# Patient Record
Sex: Female | Born: 1980
Health system: Southern US, Community
[De-identification: ages and names within clinical notes are randomized; demographics above are authoritative.]

## PROBLEM LIST (undated history)

## (undated) DIAGNOSIS — A630 Anogenital (venereal) warts: Secondary | ICD-10-CM

## (undated) DIAGNOSIS — N83209 Unspecified ovarian cyst, unspecified side: Secondary | ICD-10-CM

## (undated) DIAGNOSIS — Z9089 Acquired absence of other organs: Secondary | ICD-10-CM

## (undated) DIAGNOSIS — K449 Diaphragmatic hernia without obstruction or gangrene: Secondary | ICD-10-CM

## (undated) DIAGNOSIS — K5720 Diverticulitis of large intestine with perforation and abscess without bleeding: Secondary | ICD-10-CM

## (undated) DIAGNOSIS — F1291 Cannabis use, unspecified, in remission: Secondary | ICD-10-CM

## (undated) DIAGNOSIS — K5792 Diverticulitis of intestine, part unspecified, without perforation or abscess without bleeding: Secondary | ICD-10-CM

## (undated) DIAGNOSIS — E538 Deficiency of other specified B group vitamins: Secondary | ICD-10-CM

## (undated) DIAGNOSIS — B977 Papillomavirus as the cause of diseases classified elsewhere: Secondary | ICD-10-CM

## (undated) DIAGNOSIS — Z8742 Personal history of other diseases of the female genital tract: Secondary | ICD-10-CM

## (undated) DIAGNOSIS — Z87898 Personal history of other specified conditions: Secondary | ICD-10-CM

## (undated) DIAGNOSIS — Z8619 Personal history of other infectious and parasitic diseases: Secondary | ICD-10-CM

## (undated) DIAGNOSIS — R911 Solitary pulmonary nodule: Secondary | ICD-10-CM

## (undated) HISTORY — PX: TONSILLECTOMY: SUR1361

## (undated) HISTORY — PX: POLYPECTOMY: SHX149

## (undated) HISTORY — DX: Diaphragmatic hernia without obstruction or gangrene: K44.9

## (undated) HISTORY — PX: COLOSTOMY: SHX63

## (undated) HISTORY — DX: Solitary pulmonary nodule: R91.1

## (undated) HISTORY — DX: Diverticulitis of large intestine with perforation and abscess without bleeding: K57.20

---

## 2013-03-15 DIAGNOSIS — Z8619 Personal history of other infectious and parasitic diseases: Secondary | ICD-10-CM

## 2013-03-15 HISTORY — DX: Personal history of other infectious and parasitic diseases: Z86.19

## 2013-11-29 ENCOUNTER — Encounter (HOSPITAL_COMMUNITY): Payer: Self-pay | Admitting: *Deleted

## 2013-11-29 ENCOUNTER — Inpatient Hospital Stay (HOSPITAL_COMMUNITY): Payer: Medicaid Other

## 2013-11-29 ENCOUNTER — Inpatient Hospital Stay (HOSPITAL_COMMUNITY)
Admission: AD | Admit: 2013-11-29 | Discharge: 2013-11-29 | Disposition: A | Discharge status: Home / Self Care | Payer: Medicaid Other | Source: Ambulatory Visit | Attending: Obstetrics & Gynecology | Admitting: Obstetrics & Gynecology

## 2013-11-29 DIAGNOSIS — O99330 Smoking (tobacco) complicating pregnancy, unspecified trimester: Secondary | ICD-10-CM | POA: Diagnosis not present

## 2013-11-29 DIAGNOSIS — O98819 Other maternal infectious and parasitic diseases complicating pregnancy, unspecified trimester: Secondary | ICD-10-CM | POA: Diagnosis not present

## 2013-11-29 DIAGNOSIS — O208 Other hemorrhage in early pregnancy: Secondary | ICD-10-CM | POA: Insufficient documentation

## 2013-11-29 DIAGNOSIS — Z87891 Personal history of nicotine dependence: Secondary | ICD-10-CM | POA: Diagnosis not present

## 2013-11-29 DIAGNOSIS — O43899 Other placental disorders, unspecified trimester: Secondary | ICD-10-CM

## 2013-11-29 DIAGNOSIS — O418X10 Other specified disorders of amniotic fluid and membranes, first trimester, not applicable or unspecified: Secondary | ICD-10-CM

## 2013-11-29 DIAGNOSIS — A599 Trichomoniasis, unspecified: Secondary | ICD-10-CM

## 2013-11-29 DIAGNOSIS — O468X1 Other antepartum hemorrhage, first trimester: Secondary | ICD-10-CM

## 2013-11-29 DIAGNOSIS — A5901 Trichomonal vulvovaginitis: Secondary | ICD-10-CM | POA: Diagnosis not present

## 2013-11-29 DIAGNOSIS — O209 Hemorrhage in early pregnancy, unspecified: Secondary | ICD-10-CM | POA: Diagnosis present

## 2013-11-29 HISTORY — DX: Deficiency of other specified B group vitamins: E53.8

## 2013-11-29 HISTORY — DX: Papillomavirus as the cause of diseases classified elsewhere: B97.7

## 2013-11-29 HISTORY — DX: Diverticulitis of intestine, part unspecified, without perforation or abscess without bleeding: K57.92

## 2013-11-29 HISTORY — DX: Unspecified ovarian cyst, unspecified side: N83.209

## 2013-11-29 LAB — CBC WITH DIFFERENTIAL/PLATELET
BASOS PCT: 0 % (ref 0–1)
Basophils Absolute: 0.1 10*3/uL (ref 0.0–0.1)
Eosinophils Absolute: 0.2 10*3/uL (ref 0.0–0.7)
Eosinophils Relative: 2 % (ref 0–5)
HEMATOCRIT: 37.3 % (ref 36.0–46.0)
HEMOGLOBIN: 13.1 g/dL (ref 12.0–15.0)
Lymphocytes Relative: 18 % (ref 12–46)
Lymphs Abs: 2.4 10*3/uL (ref 0.7–4.0)
MCH: 31.6 pg (ref 26.0–34.0)
MCHC: 35.1 g/dL (ref 30.0–36.0)
MCV: 89.9 fL (ref 78.0–100.0)
MONO ABS: 0.8 10*3/uL (ref 0.1–1.0)
MONOS PCT: 6 % (ref 3–12)
Neutro Abs: 9.6 10*3/uL — ABNORMAL HIGH (ref 1.7–7.7)
Neutrophils Relative %: 74 % (ref 43–77)
Platelets: 287 10*3/uL (ref 150–400)
RBC: 4.15 MIL/uL (ref 3.87–5.11)
RDW: 13.2 % (ref 11.5–15.5)
WBC: 13.1 10*3/uL — ABNORMAL HIGH (ref 4.0–10.5)

## 2013-11-29 LAB — WET PREP, GENITAL
Clue Cells Wet Prep HPF POC: NONE SEEN
Yeast Wet Prep HPF POC: NONE SEEN

## 2013-11-29 LAB — HIV ANTIBODY (ROUTINE TESTING W REFLEX): HIV: NONREACTIVE

## 2013-11-29 LAB — ABO/RH: ABO/RH(D): A POS

## 2013-11-29 LAB — HCG, QUANTITATIVE, PREGNANCY: hCG, Beta Chain, Quant, S: 37647 m[IU]/mL — ABNORMAL HIGH (ref <5)

## 2013-11-29 LAB — POCT PREGNANCY, URINE: Preg Test, Ur: POSITIVE — AB

## 2013-11-29 MED ORDER — METRONIDAZOLE 500 MG PO TABS: 2000.0000 mg | ORAL_TABLET | Freq: Once | ORAL | Status: DC

## 2013-11-29 NOTE — MAU Provider Note (Signed)
History     CSN: [id number]  Arrival date and time: 11/29/13 1011   First Provider Initiated Contact with Patient 11/29/13 1102      Chief Complaint  Patient presents with  . Vaginal Bleeding   Vaginal Bleeding Associated symptoms include constipation and nausea. Pertinent negatives include no chills, diarrhea, dysuria, fever, headaches, urgency or vomiting.   Pt, Kristin Sawyer [redacted]w[redacted]d, is a 33 y.o. G4P0 Caucasian female with a 1 day history of vaginal bleeding and a positive home pregnancy test last month. Pt states that this morning after voiding she noticed streaks of blood on the toilet paper after wiping. Pt states that bleeding was light and without clots. Denies pelvic and abdominal pain. No bleeding since isolated episode this morning. Pt has never experienced these symptoms before as this is her first pregnancy.  Pt is sexually active. Last time having intercourse was last week. History of HPV. Pt does not have concerns about new STDs.   OB History   Grav Para Term Preterm Abortions TAB SAB Ect Mult Living   1               Past Medical History  Diagnosis Date  . B12 nutritional deficiency   . HPV in female   . Diverticulitis   . Ovarian cyst     Past Surgical History  Procedure Laterality Date  . Tonsillectomy      Family History  Problem Relation Age of Onset  . Diabetes Mother   . Diabetes Father   . Heart disease Father   . Diabetes Maternal Grandmother     History  Substance Use Topics  . Smoking status: Current Every Day Smoker -- 1.00 packs/day  . Smokeless tobacco: Not on file  . Alcohol Use: No    Allergies: Allergies not on file  No prescriptions prior to admission    Review of Systems  Constitutional: Negative for fever, chills, malaise/fatigue and diaphoresis.  Eyes: Negative for blurred vision and double vision.  Respiratory: Negative for cough and shortness of breath.   Cardiovascular: Negative for chest pain and palpitations.   Gastrointestinal: Positive for nausea and constipation. Negative for vomiting and diarrhea.  Genitourinary: Positive for vaginal bleeding. Negative for dysuria and urgency.  Neurological: Negative for dizziness, weakness and headaches.   Physical Exam   Blood pressure 120/56, pulse 82, temperature 98.6 F (37 C), temperature source Oral, resp. rate 18, height 5\' 3"  (1.6 m), weight 86.909 kg (191 lb 9.6 oz), last menstrual period 10/01/2013.  Physical Exam  Constitutional: She is oriented to person, place, and time. She appears well-developed and well-nourished.  HENT:  Head: Normocephalic and atraumatic.  Cardiovascular: Normal rate, regular rhythm, normal heart sounds and intact distal pulses.   No murmur heard. Respiratory: Effort normal and breath sounds normal. No respiratory distress.  GI: Soft. Bowel sounds are normal. She exhibits no distension. There is no tenderness. There is no guarding.  Genitourinary: There is no rash, lesion or injury on the right labia. There is no rash, lesion or injury on the left labia. Cervix exhibits no motion tenderness.  Speculum exam: Vagina - Small amount of creamy discharge, no blood, no odor Cervix - No contact bleeding Bimanual exam: Cervix closed Uterus non tender, normal size Adnexa non tender, no masses bilaterally GC/Chlam, wet prep done Chaperone present for exam.    Neurological: She is alert and oriented to person, place, and time.  Skin: Skin is warm.  Psychiatric: She has a normal mood  and affect. Her behavior is normal.   Results for orders placed during the hospital encounter of 11/29/13 (from the past 24 hour(s))  POCT PREGNANCY, URINE     Status: Abnormal   Collection Time    11/29/13 10:49 AM      Result Value Ref Range   Preg Test, Ur POSITIVE (*) NEGATIVE  CBC WITH DIFFERENTIAL     Status: Abnormal   Collection Time    11/29/13 11:06 AM      Result Value Ref Range   WBC 13.1 (*) 4.0 - 10.5 K/uL   RBC 4.15  3.87 -  5.11 MIL/uL   Hemoglobin 13.1  12.0 - 15.0 g/dL   HCT 37.3  36.0 - 46.0 %   MCV 89.9  78.0 - 100.0 fL   MCH 31.6  26.0 - 34.0 pg   MCHC 35.1  30.0 - 36.0 g/dL   RDW 13.2  11.5 - 15.5 %   Platelets 287  150 - 400 K/uL   Neutrophils Relative % 74  43 - 77 %   Neutro Abs 9.6 (*) 1.7 - 7.7 K/uL   Lymphocytes Relative 18  12 - 46 %   Lymphs Abs 2.4  0.7 - 4.0 K/uL   Monocytes Relative 6  3 - 12 %   Monocytes Absolute 0.8  0.1 - 1.0 K/uL   Eosinophils Relative 2  0 - 5 %   Eosinophils Absolute 0.2  0.0 - 0.7 K/uL   Basophils Relative 0  0 - 1 %   Basophils Absolute 0.1  0.0 - 0.1 K/uL  ABO/RH     Status: None   Collection Time    11/29/13 11:06 AM      Result Value Ref Range   ABO/RH(D) A POS    HCG, QUANTITATIVE, PREGNANCY     Status: Abnormal   Collection Time    11/29/13 11:06 AM      Result Value Ref Range   hCG, Beta Chain, Quant, [place] [address] (*) <5 mIU/mL  WET PREP, GENITAL     Status: Abnormal   Collection Time    11/29/13 11:47 AM      Result Value Ref Range   Yeast Wet Prep HPF POC NONE SEEN  NONE SEEN   Trich, Wet Prep FEW (*) NONE SEEN   Clue Cells Wet Prep HPF POC NONE SEEN  NONE SEEN   WBC, Wet Prep HPF POC MODERATE (*) NONE SEEN   US Ob Comp Less 14 Wks  11/29/2013   CLINICAL DATA:  Vaginal bleeding  EXAM: OBSTETRIC <14 WK [place] AND TRANSVAGINAL OB US  TECHNIQUE: Both transabdominal and transvaginal ultrasound examinations were performed for complete evaluation of the gestation as well as the maternal uterus, adnexal regions, and pelvic cul-de-sac. Transvaginal technique was performed to assess early pregnancy.  COMPARISON:  None.  FINDINGS: Intrauterine gestational sac: Visualized/normal in shape.  Yolk sac:  Visualized  Embryo:  Visualized  Cardiac Activity: Visualized  Heart Rate:  165 bpm  CRL:   26  mm   9 w 3 d                  [place] EDC: July 01, 2014  Maternal uterus/adnexae: There is a subchorionic hemorrhage inferior to the gestational sac measuring 1.3 x 0.7 x 0.7 cm.  Cervical os is closed. Maternal uterus otherwise appears normal. There are no maternal extrauterine pelvic or adnexal masses. No maternal free pelvic fluid.  IMPRESSION: Single live intrauterine gestation with estimated  gestational age of 38+ weeks. Small subchorionic hemorrhage.   Electronically Signed   By: Lowella Grip M.D.   On: 11/29/2013 12:49   US Ob Transvaginal  11/29/2013   CLINICAL DATA:  Vaginal bleeding  EXAM: OBSTETRIC <14 WK [place] AND TRANSVAGINAL OB US  TECHNIQUE: Both transabdominal and transvaginal ultrasound examinations were performed for complete evaluation of the gestation as well as the maternal uterus, adnexal regions, and pelvic cul-de-sac. Transvaginal technique was performed to assess early pregnancy.  COMPARISON:  None.  FINDINGS: Intrauterine gestational sac: Visualized/normal in shape.  Yolk sac:  Visualized  Embryo:  Visualized  Cardiac Activity: Visualized  Heart Rate:  165 bpm  CRL:   26  mm   9 w 3 d                  [place] EDC: July 01, 2014  Maternal uterus/adnexae: There is a subchorionic hemorrhage inferior to the gestational sac measuring 1.3 x 0.7 x 0.7 cm. Cervical os is closed. Maternal uterus otherwise appears normal. There are no maternal extrauterine pelvic or adnexal masses. No maternal free pelvic fluid.  IMPRESSION: Single live intrauterine gestation with estimated gestational age of 28+ weeks. Small subchorionic hemorrhage.   Electronically Signed   By: Lowella Grip M.D.   On: 11/29/2013 12:49    MAU Course  Procedures None  MDM +UPT UA, wet prep, GC/chlamydia, CBC, ABO/Rh, quant hCG, HIV and [place] today  Gabriel Cirri, PA-S2 11/29/2013, 11:05 AM  Assessment and Plan  A: [facility]UP at [redacted]w[redacted]d Small subchorionic hemorrhage Trichomonas  P: Discharge home Rx for Flagyl given to patient Partner treatment advised Bleeding precautions discussed Patient given list of area OB providers and advised to start prenatal care ASAP Medicaid is pending Patient  may return to MAU as needed or if her condition were to change or worsen  I have seen and evaluated the patient with the NP/PA/Med student. I agree with the assessment and plan as written above.   Luvenia Redden, PA-C  11/29/2013 12:58 PM

## 2013-11-29 NOTE — MAU Note (Signed)
Had one ep[isode of bleeding this AM but is not bleeding now; denies cramping;

## 2013-11-29 NOTE — MAU Note (Signed)
Pt reports some vaginal bleedng this morning when she wiped. Denies pain or cramping. Had +HPT last month. Has not started prenatal care yet.

## 2013-11-29 NOTE — Discharge Instructions (Signed)
Subchorionic Hematoma A subchorionic hematoma is a gathering of blood between the outer wall of the placenta and the inner wall of the womb (uterus). The placenta is the organ that connects the fetus to the wall of the uterus. The placenta performs the feeding, breathing (oxygen to the fetus), and waste removal (excretory work) of the fetus.  Subchorionic hematoma is the most common abnormality found on a result from ultrasonography done during the first trimester or early second trimester of pregnancy. If there has been little or no vaginal bleeding, early small hematomas usually shrink on their own and do not affect your baby or pregnancy. The blood is gradually absorbed over 1-2 weeks. When bleeding starts later in pregnancy or the hematoma is larger or occurs in an older pregnant woman, the outcome may not be as good. Larger hematomas may get bigger, which increases the chances for miscarriage. Subchorionic hematoma also increases the risk of premature detachment of the placenta from the uterus, preterm (premature) labor, and stillbirth. HOME CARE INSTRUCTIONS  Stay on bed rest if your health care provider recommends this. Although bed rest will not prevent more bleeding or prevent a miscarriage, your health care provider may recommend bed rest until you are advised otherwise.  Avoid heavy lifting (more than 10 lb [4.5 kg]), exercise, sexual intercourse, or douching as directed by your health care provider.  Keep track of the number of pads you use each day and how soaked (saturated) they are. Write down this information.  Do not use tampons.  Keep all follow-up appointments as directed by your health care provider. Your health care provider may ask you to have follow-up blood tests or ultrasound tests or both. SEEK IMMEDIATE MEDICAL CARE IF:  You have severe cramps in your stomach, back, abdomen, or pelvis.  You have a fever.  You pass large clots or tissue. Save any tissue for your health  care provider to look at.  Your bleeding increases or you become lightheaded, feel weak, or have fainting episodes. Document Released: 06/16/2006 Document Revised: 07/16/2013 Document Reviewed: 09/28/2012 [facility] Patient Information 2015 [place], [place]. This information is not intended to replace advice given to you by your health care provider. Make sure you discuss any questions you have with your health care provider. Trichomoniasis Trichomoniasis is an infection caused by an organism called Trichomonas. The infection can affect both women and men. In women, the outer female genitalia and the vagina are affected. In men, the penis is mainly affected, but the prostate and other reproductive organs can also be involved. Trichomoniasis is a sexually transmitted infection (STI) and is most often passed to another person through sexual contact.  RISK FACTORS  Having unprotected sexual intercourse.  Having sexual intercourse with an infected partner. SIGNS AND SYMPTOMS  Symptoms of trichomoniasis in women include:  Abnormal gray-green frothy vaginal discharge.  Itching and irritation of the vagina.  Itching and irritation of the area outside the vagina. Symptoms of trichomoniasis in men include:   Penile discharge with or without pain.  Pain during urination. This results from inflammation of the urethra. DIAGNOSIS  Trichomoniasis may be found during a Pap test or physical exam. Your health care provider may use one of the following methods to help diagnose this infection:  Examining vaginal discharge under a microscope. For men, urethral discharge would be examined.  Testing the pH of the vagina with a test tape.  Using a vaginal swab test that checks for the Trichomonas organism. A test is available that  provides results within a few minutes.  Doing a culture test for the organism. This is not usually needed. TREATMENT   You may be given medicine to fight the infection. Women  should inform their health care provider if they could be or are pregnant. Some medicines used to treat the infection should not be taken during pregnancy.  Your health care provider may recommend over-the-counter medicines or creams to decrease itching or irritation.  Your sexual partner will need to be treated if infected. HOME CARE INSTRUCTIONS   Take medicines only as directed by your health care provider.  Take over-the-counter medicine for itching or irritation as directed by your health care provider.  Do not have sexual intercourse while you have the infection.  Women should not douche or wear tampons while they have the infection.  Discuss your infection with your partner. Your partner may have gotten the infection from you, or you may have gotten it from your partner.  Have your sex partner get examined and treated if necessary.  Practice safe, informed, and protected sex.  See your health care provider for other STI testing. SEEK MEDICAL CARE IF:   You still have symptoms after you finish your medicine.  You develop abdominal pain.  You have pain when you urinate.  You have bleeding after sexual intercourse.  You develop a rash.  Your medicine makes you sick or makes you throw up (vomit). MAKE SURE YOU:  Understand these instructions.  Will watch your condition.  Will get help right away if you are not doing well or get worse. Document Released: 08/25/2000 Document Revised: 07/16/2013 Document Reviewed: 12/11/2012 [facility] Patient Information 2015 [place], [place]. This information is not intended to replace advice given to you by your health care provider. Make sure you discuss any questions you have with your health care provider.

## 2013-11-29 NOTE — MAU Provider Note (Signed)
Attestation of Attending Supervision of Advanced Practitioner (CNM/NP): Evaluation and management procedures were performed by the Advanced Practitioner under my supervision and collaboration.  I have reviewed the Advanced Practitioner's note and chart, and I agree with the management and plan.  HARRAWAY-SMITH, Loye Vento 5:10 PM

## 2013-11-30 LAB — GC/CHLAMYDIA PROBE AMP
CT PROBE, AMP APTIMA: NEGATIVE
GC Probe RNA: NEGATIVE

## 2013-12-25 ENCOUNTER — Other Ambulatory Visit: Payer: Self-pay | Admitting: Obstetrics & Gynecology

## 2013-12-25 LAB — OB RESULTS CONSOLE GC/CHLAMYDIA
CHLAMYDIA, DNA PROBE: NEGATIVE
GC PROBE AMP, GENITAL: NEGATIVE

## 2013-12-25 LAB — OB RESULTS CONSOLE ANTIBODY SCREEN: Antibody Screen: NEGATIVE

## 2013-12-25 LAB — OB RESULTS CONSOLE RUBELLA ANTIBODY, IGM: Rubella: IMMUNE

## 2013-12-25 LAB — OB RESULTS CONSOLE ABO/RH: RH Type: POSITIVE

## 2013-12-25 LAB — OB RESULTS CONSOLE HEPATITIS B SURFACE ANTIGEN: Hepatitis B Surface Ag: NEGATIVE

## 2013-12-25 LAB — OB RESULTS CONSOLE HIV ANTIBODY (ROUTINE TESTING): HIV: NONREACTIVE

## 2013-12-25 LAB — OB RESULTS CONSOLE RPR: RPR: NONREACTIVE

## 2013-12-26 LAB — CYTOLOGY - PAP

## 2014-01-15 ENCOUNTER — Encounter (HOSPITAL_COMMUNITY): Payer: Self-pay | Admitting: *Deleted

## 2014-01-17 ENCOUNTER — Other Ambulatory Visit: Payer: Self-pay | Admitting: Obstetrics & Gynecology

## 2014-01-17 DIAGNOSIS — N63 Unspecified lump in unspecified breast: Secondary | ICD-10-CM

## 2014-01-18 ENCOUNTER — Encounter (HOSPITAL_COMMUNITY): Payer: Self-pay

## 2014-01-22 ENCOUNTER — Ambulatory Visit
Admission: RE | Admit: 2014-01-22 | Discharge: 2014-01-22 | Disposition: A | Discharge status: Home / Self Care | Payer: Medicaid Other | Source: Ambulatory Visit | Attending: Obstetrics & Gynecology | Admitting: Obstetrics & Gynecology

## 2014-01-22 DIAGNOSIS — N63 Unspecified lump in unspecified breast: Secondary | ICD-10-CM

## 2014-03-15 NOTE — L&D Delivery Note (Signed)
Delivery Note At 1:21 AM a viable and healthy female was delivered via Vaginal, Spontaneous Delivery (Presentation: Left Occiput Anterior).  APGAR: 8, 9; weight  pending.   Placenta status: Intact, Spontaneous.  Cord: 3 vessels with a loose nuchal cord x 1  Pt presented to MAU in advanced labor. Upon arrival she was 6/c/0. She quickly progressed to complete. Amniotomy was performed for meconium stained fluid and the patient quickly delivered thereafter with 2 pushes. Placenta was delivered spontaneously, intact. No lacerations required repair. EBL was minimal. Uterus was firm after delivery. Estimated birth weight 5-5.5 lbs  Anesthesia: None  Episiotomy: None Lacerations: None Suture Repair: N/A Est. Blood Loss (mL): 100  Mom to postpartum.  Baby to Couplet care / Skin to Skin.  Akili Corsetti H. 06/29/2014, 1:35 AM

## 2014-05-28 LAB — OB RESULTS CONSOLE GBS: GBS: NEGATIVE

## 2014-06-24 ENCOUNTER — Ambulatory Visit (INDEPENDENT_AMBULATORY_CARE_PROVIDER_SITE_OTHER): Payer: Self-pay | Admitting: Pediatrics

## 2014-06-24 DIAGNOSIS — Z7681 Expectant parent(s) prebirth pediatrician visit: Secondary | ICD-10-CM

## 2014-06-24 DIAGNOSIS — Z3490 Encounter for supervision of normal pregnancy, unspecified, unspecified trimester: Secondary | ICD-10-CM

## 2014-06-25 NOTE — Progress Notes (Signed)
Prenatal counseling for impending newborn done-- Z76.81  

## 2014-06-28 ENCOUNTER — Inpatient Hospital Stay (HOSPITAL_COMMUNITY)
Admission: AD | Admit: 2014-06-28 | Discharge: 2014-07-02 | DRG: 775 | Disposition: A | Discharge status: Home / Self Care | Payer: Medicaid Other | Source: Ambulatory Visit | Attending: Obstetrics and Gynecology | Admitting: Obstetrics and Gynecology

## 2014-06-28 ENCOUNTER — Encounter (HOSPITAL_COMMUNITY): Payer: Self-pay | Admitting: *Deleted

## 2014-06-28 DIAGNOSIS — O09523 Supervision of elderly multigravida, third trimester: Secondary | ICD-10-CM

## 2014-06-28 DIAGNOSIS — Z3A38 38 weeks gestation of pregnancy: Secondary | ICD-10-CM | POA: Diagnosis present

## 2014-06-28 DIAGNOSIS — IMO0001 Reserved for inherently not codable concepts without codable children: Secondary | ICD-10-CM

## 2014-06-28 DIAGNOSIS — O99334 Smoking (tobacco) complicating childbirth: Secondary | ICD-10-CM | POA: Diagnosis present

## 2014-06-28 DIAGNOSIS — F17210 Nicotine dependence, cigarettes, uncomplicated: Secondary | ICD-10-CM | POA: Diagnosis present

## 2014-06-28 HISTORY — DX: Anogenital (venereal) warts: A63.0

## 2014-06-28 HISTORY — DX: Personal history of other specified conditions: Z87.898

## 2014-06-28 HISTORY — DX: Personal history of other infectious and parasitic diseases: Z86.19

## 2014-06-28 HISTORY — DX: Personal history of other diseases of the female genital tract: Z87.42

## 2014-06-28 HISTORY — DX: Acquired absence of other organs: Z90.89

## 2014-06-28 HISTORY — DX: Cannabis use, unspecified, in remission: F12.91

## 2014-06-28 NOTE — MAU Note (Signed)
Contractions since 1500. Ctxs closer and stronger. Denies LOF or bleeding

## 2014-06-29 ENCOUNTER — Encounter (HOSPITAL_COMMUNITY): Payer: Self-pay | Admitting: *Deleted

## 2014-06-29 DIAGNOSIS — IMO0001 Reserved for inherently not codable concepts without codable children: Secondary | ICD-10-CM

## 2014-06-29 DIAGNOSIS — F17210 Nicotine dependence, cigarettes, uncomplicated: Secondary | ICD-10-CM | POA: Diagnosis present

## 2014-06-29 DIAGNOSIS — Z3A38 38 weeks gestation of pregnancy: Secondary | ICD-10-CM | POA: Diagnosis present

## 2014-06-29 DIAGNOSIS — O09523 Supervision of elderly multigravida, third trimester: Secondary | ICD-10-CM | POA: Diagnosis not present

## 2014-06-29 DIAGNOSIS — Z3403 Encounter for supervision of normal first pregnancy, third trimester: Secondary | ICD-10-CM | POA: Diagnosis present

## 2014-06-29 DIAGNOSIS — O99334 Smoking (tobacco) complicating childbirth: Secondary | ICD-10-CM | POA: Diagnosis present

## 2014-06-29 LAB — CBC
HCT: 32 % — ABNORMAL LOW (ref 36.0–46.0)
HCT: 34.9 % — ABNORMAL LOW (ref 36.0–46.0)
Hemoglobin: 11.3 g/dL — ABNORMAL LOW (ref 12.0–15.0)
Hemoglobin: 12.5 g/dL (ref 12.0–15.0)
MCH: 31.7 pg (ref 26.0–34.0)
MCH: 32.1 pg (ref 26.0–34.0)
MCHC: 35.3 g/dL (ref 30.0–36.0)
MCHC: 35.8 g/dL (ref 30.0–36.0)
MCV: 89.7 fL (ref 78.0–100.0)
MCV: 89.9 fL (ref 78.0–100.0)
PLATELETS: 221 10*3/uL (ref 150–400)
PLATELETS: 245 10*3/uL (ref 150–400)
RBC: 3.56 MIL/uL — ABNORMAL LOW (ref 3.87–5.11)
RBC: 3.89 MIL/uL (ref 3.87–5.11)
RDW: 13.1 % (ref 11.5–15.5)
RDW: 13.1 % (ref 11.5–15.5)
WBC: 17.5 10*3/uL — ABNORMAL HIGH (ref 4.0–10.5)
WBC: 19 10*3/uL — ABNORMAL HIGH (ref 4.0–10.5)

## 2014-06-29 LAB — TYPE AND SCREEN
ABO/RH(D): A POS
Antibody Screen: NEGATIVE

## 2014-06-29 LAB — RPR: RPR Ser Ql: NONREACTIVE

## 2014-06-29 MED ORDER — IBUPROFEN 600 MG PO TABS
600.0000 mg | ORAL_TABLET | Freq: Four times a day (QID) | ORAL | Status: DC
Start: 2014-06-29 — End: 2014-07-02
  Administered 2014-06-29 – 2014-07-02 (×13): 600 mg via ORAL
  Filled 2014-06-29 (×15): qty 1

## 2014-06-29 MED ORDER — EPHEDRINE 5 MG/ML INJ
10.0000 mg | INTRAVENOUS | Status: DC | PRN
  Filled 2014-06-29: qty 2

## 2014-06-29 MED ORDER — OXYCODONE-ACETAMINOPHEN 5-325 MG PO TABS: 2.0000 | ORAL_TABLET | ORAL | Status: DC | PRN

## 2014-06-29 MED ORDER — DIBUCAINE 1 % RE OINT: 1.0000 "application " | TOPICAL_OINTMENT | RECTAL | Status: DC | PRN

## 2014-06-29 MED ORDER — DIPHENHYDRAMINE HCL 50 MG/ML IJ SOLN: 12.5000 mg | INTRAMUSCULAR | Status: DC | PRN

## 2014-06-29 MED ORDER — FENTANYL 2.5 MCG/ML BUPIVACAINE 1/10 % EPIDURAL INFUSION (WH - ANES): 14.0000 mL/h | INTRAMUSCULAR | Status: DC | PRN

## 2014-06-29 MED ORDER — OXYTOCIN 40 UNITS IN LACTATED RINGERS INFUSION - SIMPLE MED: 62.5000 mL/h | INTRAVENOUS | Status: DC

## 2014-06-29 MED ORDER — METHYLERGONOVINE MALEATE 0.2 MG/ML IJ SOLN
0.2000 mg | INTRAMUSCULAR | Status: DC | PRN
Start: 2014-06-29 — End: 2014-07-02

## 2014-06-29 MED ORDER — FLEET ENEMA 7-19 GM/118ML RE ENEM: 1.0000 | ENEMA | RECTAL | Status: DC | PRN

## 2014-06-29 MED ORDER — DIPHENHYDRAMINE HCL 25 MG PO CAPS: 25.0000 mg | ORAL_CAPSULE | Freq: Four times a day (QID) | ORAL | Status: DC | PRN

## 2014-06-29 MED ORDER — OXYCODONE-ACETAMINOPHEN 5-325 MG PO TABS: 1.0000 | ORAL_TABLET | ORAL | Status: DC | PRN

## 2014-06-29 MED ORDER — ACETAMINOPHEN 325 MG PO TABS: 650.0000 mg | ORAL_TABLET | ORAL | Status: DC | PRN

## 2014-06-29 MED ORDER — ONDANSETRON HCL 4 MG/2ML IJ SOLN: 4.0000 mg | Freq: Four times a day (QID) | INTRAMUSCULAR | Status: DC | PRN

## 2014-06-29 MED ORDER — METHYLERGONOVINE MALEATE 0.2 MG PO TABS: 0.2000 mg | ORAL_TABLET | ORAL | Status: DC | PRN

## 2014-06-29 MED ORDER — LANOLIN HYDROUS EX OINT: TOPICAL_OINTMENT | CUTANEOUS | Status: DC | PRN

## 2014-06-29 MED ORDER — PHENYLEPHRINE 40 MCG/ML (10ML) SYRINGE FOR IV PUSH (FOR BLOOD PRESSURE SUPPORT)
80.0000 ug | PREFILLED_SYRINGE | INTRAVENOUS | Status: DC | PRN
  Filled 2014-06-29: qty 2

## 2014-06-29 MED ORDER — LIDOCAINE HCL (PF) 1 % IJ SOLN
30.0000 mL | INTRAMUSCULAR | Status: DC | PRN
  Filled 2014-06-29: qty 30

## 2014-06-29 MED ORDER — LACTATED RINGERS IV SOLN: 500.0000 mL | INTRAVENOUS | Status: DC | PRN

## 2014-06-29 MED ORDER — LACTATED RINGERS IV SOLN: INTRAVENOUS | Status: DC

## 2014-06-29 MED ORDER — PRENATAL MULTIVITAMIN CH
1.0000 | ORAL_TABLET | Freq: Every day | ORAL | Status: DC
Start: 2014-06-29 — End: 2014-07-02
  Administered 2014-06-29 – 2014-07-02 (×4): 1 via ORAL
  Filled 2014-06-29 (×4): qty 1

## 2014-06-29 MED ORDER — ONDANSETRON HCL 4 MG PO TABS: 4.0000 mg | ORAL_TABLET | ORAL | Status: DC | PRN

## 2014-06-29 MED ORDER — ZOLPIDEM TARTRATE 5 MG PO TABS: 5.0000 mg | ORAL_TABLET | Freq: Every evening | ORAL | Status: DC | PRN

## 2014-06-29 MED ORDER — WITCH HAZEL-GLYCERIN EX PADS: 1.0000 "application " | MEDICATED_PAD | CUTANEOUS | Status: DC | PRN

## 2014-06-29 MED ORDER — SIMETHICONE 80 MG PO CHEW: 80.0000 mg | CHEWABLE_TABLET | ORAL | Status: DC | PRN

## 2014-06-29 MED ORDER — ONDANSETRON HCL 4 MG/2ML IJ SOLN: 4.0000 mg | INTRAMUSCULAR | Status: DC | PRN

## 2014-06-29 MED ORDER — CITRIC ACID-SODIUM CITRATE 334-500 MG/5ML PO SOLN: 30.0000 mL | ORAL | Status: DC | PRN

## 2014-06-29 MED ORDER — SENNOSIDES-DOCUSATE SODIUM 8.6-50 MG PO TABS
2.0000 | ORAL_TABLET | ORAL | Status: DC
  Administered 2014-06-30 – 2014-07-01 (×3): 2 via ORAL
  Filled 2014-06-29 (×3): qty 2

## 2014-06-29 MED ORDER — LACTATED RINGERS IV SOLN
500.0000 mL | Freq: Once | INTRAVENOUS | Status: DC
Start: 2014-06-29 — End: 2014-06-29

## 2014-06-29 MED ORDER — PNEUMOCOCCAL VAC POLYVALENT 25 MCG/0.5ML IJ INJ
0.5000 mL | INJECTION | INTRAMUSCULAR | Status: DC
  Filled 2014-06-29: qty 0.5

## 2014-06-29 MED ORDER — BENZOCAINE-MENTHOL 20-0.5 % EX AERO: 1.0000 "application " | INHALATION_SPRAY | CUTANEOUS | Status: DC | PRN

## 2014-06-29 MED ORDER — TETANUS-DIPHTH-ACELL PERTUSSIS 5-2.5-18.5 LF-MCG/0.5 IM SUSP: 0.5000 mL | Freq: Once | INTRAMUSCULAR | Status: DC

## 2014-06-29 MED ORDER — OXYCODONE-ACETAMINOPHEN 5-325 MG PO TABS
1.0000 | ORAL_TABLET | ORAL | Status: DC | PRN
  Administered 2014-06-29: 1 via ORAL
  Filled 2014-06-29: qty 1

## 2014-06-29 MED ORDER — OXYTOCIN BOLUS FROM INFUSION
500.0000 mL | INTRAVENOUS | Status: DC
  Administered 2014-06-29: 500 mL via INTRAVENOUS

## 2014-06-29 NOTE — H&P (Signed)
Kristin Sawyer is a 34 y.o. female presenting for labor  34 yo G1P0 @ 38+5 presents in advanced labor and was admitted History OB History    Gravida Para Term Preterm AB TAB SAB Ectopic Multiple Living   1              Past Medical History  Diagnosis Date  . B12 nutritional deficiency   . HPV in female   . Diverticulitis   . Ovarian cyst    Past Surgical History  Procedure Laterality Date  . Tonsillectomy     Family History: family history includes Diabetes in her father, maternal grandmother, and mother; Heart disease in her father. Social History:  reports that she has been smoking.  She does not have any smokeless tobacco history on file. She reports that she does not drink alcohol or use illicit drugs.   Prenatal Transfer Tool  Maternal Diabetes: No Genetic Screening: Normal Maternal Ultrasounds/Referrals: Normal Fetal Ultrasounds or other Referrals:  None Maternal Substance Abuse:  Tobacco abuse Significant Maternal Medications:  None Significant Maternal Lab Results:  None Other Comments:  None  ROS: as above  Dilation: 6 Effacement (%): 90 Station: 0 Exam by:: K.WIlson,RN Blood pressure 127/70, pulse 97, temperature 97.8 F (36.6 C), resp. rate 22, height 5\' 3"  (1.6 m), weight 113.036 kg (249 lb 3.2 oz), last menstrual period 10/01/2013. Exam Physical Exam  Prenatal labs: ABO, Rh: --/--/A POS (04/16 0020) Antibody: NEG (04/16 0020) Rubella: Immune (10/13 0000) RPR: Nonreactive (10/13 0000)  HBsAg: Negative (10/13 0000)  HIV: Non-reactive (10/13 0000)  GBS: Negative (03/15 0000)   Assessment/Plan: 1) Admit 2) Expectant management 3) Will attempt to get epidural   Kristin Sawyer H. 06/29/2014, 1:39 AM

## 2014-06-29 NOTE — Lactation Note (Addendum)
This note was copied from the chart of [place]. Lactation Consultation Note Baby went to NICU. Set mom up w/DEBP. Had BF class at [facility]. Mom has large pendulum breast and large nipples. With a 4 lb. 9 oz. Baby I have concerns if baby can latch deep enough to get adequate milk transfer. Hand expression taught w/61ml colostrum. Gave vials w/#'s and NICU booklet. Mom shown how to use DEBP & how to disassemble, clean, & reassemble parts. Mom pumped for 15 min. W/colostrum in ring of #27 flange. Mom knows to pump q3h for 15-20 min. Mom has a DEBP at home.  Encouraged to massage breast at intervals during pumping and BF.Encouraged comfort during BF so colostrum flows better and mom will enjoy the feeding longer. Taking deep breaths and breast massage during BF. Referred to Baby and Me Book in Breastfeeding section Pg. 22-23 for position options and Proper latch demonstration. Mom encouraged to feed baby 8-12 times/24 hours and with feeding cues. (when on normal schedule and has baby with her).  Mom encouraged to do skin-to-skin as much as possible when she can.  [place] brochure given w/resources, support groups and [place] services.  Mother is at bedside. Haven't seen FOB and she hasn't mentioned one.  Mom is very tired, encouraged to rest when she could.    Patient Name: Kristin Sawyer [id number] Date: 06/29/2014 Reason for consult: Initial assessment   Maternal Data Has patient been taught Hand Expression?: Yes Does the patient have breastfeeding experience prior to this delivery?: No  Feeding Feeding Type: Bottle Fed - Formula Nipple Type: Slow - flow Length of feed: 15 min  LATCH Score/Interventions       Type of Nipple: Everted at rest and after stimulation  Comfort (Breast/Nipple): Soft / non-tender     Intervention(s): Breastfeeding basics reviewed;Support Pillows;Position options;Skin to skin     Lactation Tools Discussed/Used Tools: Pump Breast pump type:  Double-Electric Breast Pump Pump Review: Setup, frequency, and cleaning;Milk Storage Initiated by:: Allayne Stack RN Date initiated:: 06/29/14   Consult Status Consult Status: Follow-up Date: 06/30/14 Follow-up type: In-patient    Theodoro Kalata 06/29/2014, 12:49 PM

## 2014-06-29 NOTE — Progress Notes (Signed)
Post Partum Day 0 Subjective: no complaints, up ad lib, voiding, tolerating PO and + flatus  Objective: Blood pressure 113/59, pulse 80, temperature 98.3 F (36.8 C), resp. rate 18, height 5\' 3"  (1.6 m), weight 112.946 kg (249 lb), last menstrual period 10/01/2013, unknown if currently breastfeeding.  Physical Exam:  General: alert, cooperative and appears stated age 34: appropriate Uterine Fundus: firm  Recent Labs  06/29/14 0020 06/29/14 0545  HGB 12.5 11.3*  HCT 34.9* 32.0*    Assessment/Plan: Breastfeeding  Baby transferred to NICU d/t hypoglycemia. Only 4#9 at delivery Routine PP care   LOS: 0 days   Linnae Rasool H. 06/29/2014, 11:54 AM

## 2014-06-30 NOTE — Progress Notes (Signed)
Post Partum Day 1 Subjective: no complaints, up ad lib, voiding, tolerating PO and + flatus  Objective: Blood pressure 100/60, pulse 79, temperature 97.9 F (36.6 C), temperature source Oral, resp. rate 18, height 5\' 3"  (1.6 m), weight 112.946 kg (249 lb), last menstrual period 10/01/2013, SpO2 100 %, unknown if currently breastfeeding.  Physical Exam:  General: alert, cooperative and appears stated age Lochia: appropriate Uterine Fundus: firm   Recent Labs  06/29/14 0020 06/29/14 0545  HGB 12.5 11.3*  HCT 34.9* 32.0*    Assessment/Plan: Plan for discharge tomorrow  Baby still in NICU, will likely be there through the week   LOS: 1 day   Kristin Sawyer H. 06/30/2014, 12:07 PM

## 2014-07-01 MED FILL — Oxytocin-Lactated Ringers IV Soln 40 Unit/1000ML: INTRAVENOUS | Qty: 1000 | Status: AC

## 2014-07-01 NOTE — Progress Notes (Signed)
UR chart review completed.  

## 2014-07-01 NOTE — Progress Notes (Signed)
PPD#2 Pt has mild lochia, normal pain. Baby improving in NICU. VSSAF IMP/ stable Plan/ routine care.

## 2014-07-01 NOTE — Lactation Note (Signed)
This note was copied from the chart of [place]. Lactation Consultation Note Follow up visit made to assist mom with breastfeeding in NICU.  Baby was latched and sucking actively with swallows when I arrived.  Glennon Mac quickly fell asleep at breast and would not relatch.  Positioned baby in football hold and 20 mm nipple shield applied.  Baby latched and nursed another 6-7 minutes.  Milk in nipple shield when baby came off.  Mom is pumping every 3 hours and last obtained 20 mls.  Encouraged to call with concerns/assist prn.  Patient Name: Kristin Sawyer [id number] Date: 07/01/2014 Reason for consult: Follow-up assessment   Maternal Data    Feeding Feeding Type: Breast Fed Length of feed: 15 min  LATCH Score/Interventions Latch: Grasps breast easily, tongue down, lips flanged, rhythmical sucking. Intervention(s): Adjust position;Assist with latch;Breast massage;Breast compression  Audible Swallowing: A few with stimulation Intervention(s): Hand expression;Skin to skin  Type of Nipple: Everted at rest and after stimulation  Comfort (Breast/Nipple): Soft / non-tender     Hold (Positioning): Assistance needed to correctly position infant at breast and maintain latch. Intervention(s): Breastfeeding basics reviewed;Support Pillows;Position options;Skin to skin  LATCH Score: 8  Lactation Tools Discussed/Used Tools: Nipple Shields Nipple shield size: 20   Consult Status Consult Status: PRN    Ave Filter 07/01/2014, 10:40 AM

## 2014-07-02 NOTE — Lactation Note (Signed)
This note was copied from the chart of [place]. Lactation Consultation Note  Mom is pumping consistently and expressing about 35 ml per session.  Encouraged her to continue pumping every 3 hours at night.  She reports having a double electric breast pump and home.  Follow-up in NICU as needed.  Patient Name: Kristin Sawyer [id number] Date: 07/02/2014     Maternal Data    Feeding Feeding Type: Breast Milk with Formula added Length of feed: 30 min  LATCH Score/Interventions Latch: Grasps breast easily, tongue down, lips flanged, rhythmical sucking.  Audible Swallowing: Spontaneous and intermittent  Type of Nipple: Everted at rest and after stimulation  Comfort (Breast/Nipple): Soft / non-tender     Hold (Positioning): No assistance needed to correctly position infant at breast.  LATCH Score: 10  Lactation Tools Discussed/Used     Consult Status      [name] 07/02/2014, 9:15 AM

## 2014-07-02 NOTE — Discharge Summary (Signed)
Obstetric Discharge Summary Reason for Admission: onset of labor Prenatal Procedures: ultrasound Intrapartum Procedures: spontaneous vaginal delivery Postpartum Procedures: none Complications-Operative and Postpartum: none HEMOGLOBIN  Date Value Ref Range Status  06/29/2014 11.3* 12.0 - 15.0 g/dL Final   HCT  Date Value Ref Range Status  06/29/2014 32.0* 36.0 - 46.0 % Final    Physical Exam:  General: alert and cooperative Lochia: appropriate Uterine Fundus: firm DVT Evaluation: No evidence of DVT seen on physical exam.  Discharge Diagnoses: Term Pregnancy-delivered  Discharge Information: Date: 07/02/2014 Activity: pelvic rest Diet: routine Medications: PNV and Ibuprofen Condition: stable Instructions: refer to practice specific booklet Discharge to: home Follow-up Information    Follow up with Marcial Pacas., MD In 4 weeks.   Specialty:  Obstetrics and Gynecology   Contact information:   [address] [place] [id number] [phone]       Newborn Data: Live born female  Birth Weight: 4 lb 9 oz (2070 g) APGAR: 8, 9  Baby in [place], Casimer Bilis 07/02/2014, 12:24 PM

## 2014-07-03 ENCOUNTER — Ambulatory Visit: Payer: Self-pay

## 2014-07-03 NOTE — Lactation Note (Signed)
This note was copied from the chart of [place]. Lactation Consultation Note  Follow up visit with mom in NICU.  She states pumping is going well and she is obtaining 30 mls per pumping. Holding baby skin to skin after a feeding.  Encouraged to call with concerns/assist prn.  Patient Name: Kristin Sawyer [id number] Date: 07/03/2014     Maternal Data    Feeding Feeding Type: Breast Milk (Chnaged Feeding Schedule due to IV attempt ) Length of feed: 30 min  LATCH Score/Interventions                      Lactation Tools Discussed/Used     Consult Status      Ave Filter 07/03/2014, 2:25 PM

## 2014-07-08 ENCOUNTER — Ambulatory Visit: Payer: Self-pay

## 2014-07-08 NOTE — Lactation Note (Signed)
This note was copied from the chart of [place]. Lactation Consultation Note  Patient Name: Kristin Sawyer [id number] Date: 07/08/2014 Reason for consult: Follow-up assessment  With this mom of a NICU baby, now 6 days old and full term. Mom is pumping but has a low milk supply, about 50 mls each pumping, 8 times a day. I recommended she add power pump once a day, stay hydrated, to decrease pumping to every 4 hours times 2 at night, and increase pumpings during the day, to see if a little more sleep will help. i also gave mom information on fenugreeek, moringa dn mother's love herbs, and suggested she try one or a combination of these. Mom knows to call for questions/concerns   Maternal Data    Feeding Feeding Type: Breast Milk Length of feed: 120 min  LATCH Score/Interventions Latch: Grasps breast easily, tongue down, lips flanged, rhythmical sucking. Intervention(s): Adjust position  Audible Swallowing: Spontaneous and intermittent Intervention(s): Skin to skin;Hand expression Intervention(s): Skin to skin  Type of Nipple: Everted at rest and after stimulation  Comfort (Breast/Nipple): Soft / non-tender     Hold (Positioning): No assistance needed to correctly position infant at breast. Intervention(s): Skin to skin  LATCH Score: 10  Lactation Tools Discussed/Used     Consult Status Consult Status: PRN Follow-up type: In-patient (NICU)    Tonna [place] 07/08/2014, 1:00 PM

## 2016-06-14 ENCOUNTER — Ambulatory Visit (INDEPENDENT_AMBULATORY_CARE_PROVIDER_SITE_OTHER): Payer: BLUE CROSS/BLUE SHIELD | Admitting: Physician Assistant

## 2016-06-14 ENCOUNTER — Ambulatory Visit (INDEPENDENT_AMBULATORY_CARE_PROVIDER_SITE_OTHER): Payer: BLUE CROSS/BLUE SHIELD

## 2016-06-14 ENCOUNTER — Encounter: Payer: Self-pay | Admitting: Physician Assistant

## 2016-06-14 VITALS — BP 98/62 | HR 73 | Ht 63.0 in | Wt 291.0 lb

## 2016-06-14 DIAGNOSIS — M545 Low back pain: Secondary | ICD-10-CM | POA: Diagnosis not present

## 2016-06-14 DIAGNOSIS — R531 Weakness: Secondary | ICD-10-CM | POA: Diagnosis not present

## 2016-06-14 DIAGNOSIS — F339 Major depressive disorder, recurrent, unspecified: Secondary | ICD-10-CM

## 2016-06-14 DIAGNOSIS — R5383 Other fatigue: Secondary | ICD-10-CM

## 2016-06-14 LAB — CBC
HCT: 42.6 % (ref 35.0–45.0)
Hemoglobin: 14 g/dL (ref 11.7–15.5)
MCH: 29.4 pg (ref 27.0–33.0)
MCHC: 32.9 g/dL (ref 32.0–36.0)
MCV: 89.5 fL (ref 80.0–100.0)
MPV: 10 fL (ref 7.5–12.5)
Platelets: 357 10*3/uL (ref 140–400)
RBC: 4.76 MIL/uL (ref 3.80–5.10)
RDW: 13.4 % (ref 11.0–15.0)
WBC: 11.5 10*3/uL — ABNORMAL HIGH (ref 3.8–10.8)

## 2016-06-14 LAB — POCT URINALYSIS DIPSTICK
BILIRUBIN UA: NEGATIVE
GLUCOSE UA: NEGATIVE
Ketones, UA: NEGATIVE
LEUKOCYTES UA: NEGATIVE
Nitrite, UA: NEGATIVE
PH UA: 6 (ref 5.0–8.0)
Protein, UA: NEGATIVE
Spec Grav, UA: 1.01 (ref 1.030–1.035)
Urobilinogen, UA: 0.2 (ref ?–2.0)

## 2016-06-14 LAB — FERRITIN: Ferritin: 26 ng/mL (ref 10–154)

## 2016-06-14 LAB — TSH: TSH: 1.51 mIU/L

## 2016-06-14 LAB — VITAMIN B12: VITAMIN B 12: 253 pg/mL (ref 200–1100)

## 2016-06-14 MED ORDER — PHENTERMINE HCL 37.5 MG PO TABS: 37.5000 mg | ORAL_TABLET | Freq: Every day | ORAL | 0 refills | Status: DC

## 2016-06-14 MED ORDER — MELOXICAM 15 MG PO TABS: 15.0000 mg | ORAL_TABLET | Freq: Every day | ORAL | 2 refills | Status: DC

## 2016-06-14 MED ORDER — VENLAFAXINE HCL ER 37.5 MG PO CP24: 37.5000 mg | ORAL_CAPSULE | Freq: Every day | ORAL | 1 refills | Status: DC

## 2016-06-14 NOTE — Progress Notes (Signed)
Call pt: normal alignment of spine. Good disc spaces. I do think this is muscular and that PT will help.

## 2016-06-14 NOTE — Progress Notes (Signed)
Subjective:    Patient ID: Kristin Sawyer, female    DOB: May 12, 1980, 36 y.o.   MRN: [id number]  HPI  Pt is a 36 yo female who presents to the clinic to establish care.   .. Active Ambulatory Problems    Diagnosis Date Noted  . Active labor 06/29/2014  . Spontaneous vaginal delivery 06/29/2014  . Low iron stores 06/15/2016  . Pernicious anemia 06/15/2016  . Vitamin D deficiency 06/15/2016  . Malnutrition of mild degree ([place]) 06/15/2016  . No energy 06/15/2016  . Depression, recurrent ([place]) 06/15/2016  . Low back pain 06/15/2016   Resolved Ambulatory Problems    Diagnosis Date Noted  . No Resolved Ambulatory Problems   Past Medical History:  Diagnosis Date  . B12 nutritional deficiency   . Condyloma   . Diverticulitis   . History of marijuana use   . History of trichomoniasis 2015  . HPV in female   . Hx of abnormal cervical Papanicolaou smear   . Hx of adenoidectomy   . Hx of tonsillectomy   . Ovarian cyst    .. Family History  Problem Relation Age of Onset  . Diabetes Mother   . Diabetes Father   . Heart disease Father   . Diabetes Maternal Grandmother   . Depression Sister   . Cancer Maternal Aunt   . Alcohol abuse Maternal Uncle    .Marland Kitchen Social History   Social History  . Marital status: Single    Spouse name: N/A  . Number of children: N/A  . Years of education: N/A   Occupational History  . Not on file.   Social History Main Topics  . Smoking status: Current Every Day Smoker    Packs/day: 1.00  . Smokeless tobacco: Never Used  . Alcohol use No  . Drug use: No  . Sexual activity: Not Currently   Other Topics Concern  . Not on file   Social History Narrative  . No narrative on file    Pt is disgusted weight weight and back pain. She has had low back pain off and on for years. It continues to get worse. No radiation down leg, saddle anesthia, or bowel or bladder dysfunction. She takes ibuprofen as needed. Worse with bending and lifting.  Worse when standing for long periods of time. She is not dieting or exercising. She is a single mother with a 53 year old. She wants to be active again.    Review of Systems    see HPI.  Objective:   Physical Exam  Constitutional: She is oriented to person, place, and time. She appears well-developed and well-nourished.  Morbidly obese.   HENT:  Head: Normocephalic and atraumatic.  Cardiovascular: Normal rate, regular rhythm and normal heart sounds.   Pulmonary/Chest: Effort normal and breath sounds normal.  Musculoskeletal:  Decreased ROM at waist due to pain.  No lumbar spinal tenderness.  Tenderness over SI joints and lower buttock muscles.  Negative straight leg test bilaterally.  2+ patellar reflexes.   Neurological: She is alert and oriented to person, place, and time.  Psychiatric:  Flat affect. Tearful discussing health.           Assessment & Plan:  Marland KitchenMarland KitchenChanin was seen today for establish care and back pain.  Diagnoses and all orders for this visit:  Low back pain, unspecified back pain laterality, unspecified chronicity, with sciatica presence unspecified -     POCT urinalysis dipstick -     meloxicam (MOBIC) 15 MG  tablet; Take 1 tablet (15 mg total) by mouth daily. -     DG Lumbar Spine Complete; Future -     Ambulatory referral to Physical Therapy  No energy -     CBC -     Comprehensive metabolic panel -     C-reactive protein -     Ferritin -     Sedimentation rate -     TSH -     Vitamin B12 -     Vit D  25 hydroxy (rtn osteoporosis monitoring)  Depression, recurrent (HCC) -     venlafaxine XR (EFFEXOR-XR) 37.5 MG 24 hr capsule; Take 1 capsule (37.5 mg total) by mouth daily with breakfast.  Morbid obesity ([place]) -     phentermine (ADIPEX-P) 37.5 MG tablet; Take 1 tablet (37.5 mg total) by mouth daily before breakfast. -     Ambulatory referral to Physical Therapy  Other orders -     Vitamin D, Ergocalciferol, (DRISDOL) 50000 units CAPS capsule;  Take 1 capsule (50,000 Units total) by mouth every 7 (seven) days.   Discussed symptomatic care for back. PT, NSAID, heat, ice stretches. Follow up in 4-6 weeks.    .. Depression screen [facility] 2/9 06/14/2016  Decreased Interest 2  Down, Depressed, Hopeless 1  PHQ - 2 Score 3  Altered sleeping 1  Tired, decreased energy 3  Change in appetite 3  Feeling bad or failure about yourself  1  Trouble concentrating 0  Moving slowly or fidgety/restless 0  Suicidal thoughts 0  PHQ-9 Score 11   Started effexor for depression. Discussed side effects. Follow up in 4-6 weeks.   Pt has to lose weight.  Started phentermine. Denies any hx of cardiac arrhthymias. Start with 1/2 tablet. Follow up in 4-6 weeks.

## 2016-06-15 ENCOUNTER — Encounter: Payer: Self-pay | Admitting: Physician Assistant

## 2016-06-15 DIAGNOSIS — Z6841 Body Mass Index (BMI) 40.0 and over, adult: Secondary | ICD-10-CM | POA: Insufficient documentation

## 2016-06-15 DIAGNOSIS — E441 Mild protein-calorie malnutrition: Secondary | ICD-10-CM | POA: Insufficient documentation

## 2016-06-15 DIAGNOSIS — R5383 Other fatigue: Secondary | ICD-10-CM | POA: Insufficient documentation

## 2016-06-15 DIAGNOSIS — F339 Major depressive disorder, recurrent, unspecified: Secondary | ICD-10-CM | POA: Insufficient documentation

## 2016-06-15 DIAGNOSIS — M545 Low back pain, unspecified: Secondary | ICD-10-CM | POA: Insufficient documentation

## 2016-06-15 DIAGNOSIS — E559 Vitamin D deficiency, unspecified: Secondary | ICD-10-CM | POA: Insufficient documentation

## 2016-06-15 DIAGNOSIS — D510 Vitamin B12 deficiency anemia due to intrinsic factor deficiency: Secondary | ICD-10-CM | POA: Insufficient documentation

## 2016-06-15 DIAGNOSIS — R790 Abnormal level of blood mineral: Secondary | ICD-10-CM | POA: Insufficient documentation

## 2016-06-15 LAB — C-REACTIVE PROTEIN: CRP: 6 mg/L (ref <8.0)

## 2016-06-15 LAB — COMPREHENSIVE METABOLIC PANEL
ALK PHOS: 59 U/L (ref 33–115)
ALT: 6 U/L (ref 6–29)
AST: 11 U/L (ref 10–30)
Albumin: 3.5 g/dL — ABNORMAL LOW (ref 3.6–5.1)
BILIRUBIN TOTAL: 0.2 mg/dL (ref 0.2–1.2)
BUN: 9 mg/dL (ref 7–25)
CALCIUM: 8.5 mg/dL — AB (ref 8.6–10.2)
CO2: 24 mmol/L (ref 20–31)
Chloride: 105 mmol/L (ref 98–110)
Creat: 0.8 mg/dL (ref 0.50–1.10)
Glucose, Bld: 91 mg/dL (ref 65–99)
Potassium: 4.4 mmol/L (ref 3.5–5.3)
Sodium: 138 mmol/L (ref 135–146)
Total Protein: 6 g/dL — ABNORMAL LOW (ref 6.1–8.1)

## 2016-06-15 LAB — VITAMIN D 25 HYDROXY (VIT D DEFICIENCY, FRACTURES): Vit D, 25-Hydroxy: 13 ng/mL — ABNORMAL LOW (ref 30–100)

## 2016-06-15 LAB — SEDIMENTATION RATE: Sed Rate: 2 mm/hr (ref 0–20)

## 2016-06-15 MED ORDER — VITAMIN D (ERGOCALCIFEROL) 1.25 MG (50000 UNIT) PO CAPS: 50000.0000 [IU] | ORAL_CAPSULE | ORAL | 0 refills | Status: DC

## 2016-08-11 ENCOUNTER — Other Ambulatory Visit: Payer: Self-pay | Admitting: Physician Assistant

## 2016-08-11 DIAGNOSIS — F339 Major depressive disorder, recurrent, unspecified: Secondary | ICD-10-CM

## 2016-10-02 ENCOUNTER — Other Ambulatory Visit: Payer: Self-pay | Admitting: Physician Assistant

## 2016-10-02 DIAGNOSIS — M545 Low back pain: Secondary | ICD-10-CM

## 2016-11-08 ENCOUNTER — Other Ambulatory Visit: Payer: Self-pay | Admitting: Physician Assistant

## 2016-11-08 DIAGNOSIS — M545 Low back pain: Secondary | ICD-10-CM

## 2016-12-30 ENCOUNTER — Encounter: Payer: Self-pay | Admitting: Family Medicine

## 2016-12-30 DIAGNOSIS — R87612 Low grade squamous intraepithelial lesion on cytologic smear of cervix (LGSIL): Secondary | ICD-10-CM | POA: Insufficient documentation

## 2017-05-31 ENCOUNTER — Ambulatory Visit (INDEPENDENT_AMBULATORY_CARE_PROVIDER_SITE_OTHER): Payer: BLUE CROSS/BLUE SHIELD

## 2017-05-31 ENCOUNTER — Ambulatory Visit (INDEPENDENT_AMBULATORY_CARE_PROVIDER_SITE_OTHER): Payer: BLUE CROSS/BLUE SHIELD | Admitting: Physician Assistant

## 2017-05-31 ENCOUNTER — Encounter: Payer: Self-pay | Admitting: Physician Assistant

## 2017-05-31 VITALS — BP 114/44 | HR 67 | Temp 97.8°F | Ht 63.0 in | Wt 280.0 lb

## 2017-05-31 DIAGNOSIS — G4452 New daily persistent headache (NDPH): Secondary | ICD-10-CM | POA: Diagnosis not present

## 2017-05-31 DIAGNOSIS — R112 Nausea with vomiting, unspecified: Secondary | ICD-10-CM

## 2017-05-31 DIAGNOSIS — G43901 Migraine, unspecified, not intractable, with status migrainosus: Secondary | ICD-10-CM | POA: Diagnosis not present

## 2017-05-31 MED ORDER — ONDANSETRON HCL 4 MG PO TABS: 4.0000 mg | ORAL_TABLET | Freq: Three times a day (TID) | ORAL | 0 refills | Status: DC | PRN

## 2017-05-31 MED ORDER — DEXAMETHASONE SODIUM PHOSPHATE 4 MG/ML IJ SOLN
4.0000 mg | Freq: Once | INTRAMUSCULAR | Status: AC
  Administered 2017-05-31: 4 mg via INTRAMUSCULAR

## 2017-05-31 NOTE — Progress Notes (Signed)
FYI. Patient already called with results at 5:10pm. Pt will continue with treatment plan discussed in office and call if no improvement in headache.

## 2017-05-31 NOTE — Progress Notes (Signed)
Subjective:    Patient ID: Kristin Sawyer, female    DOB: January 25, 1981, 37 y.o.   MRN: [id number]  HPI Patient is a 37 year old female who presents with a new headache for the past 4 days. Patient states headache started on 3/16 and has been constant since. Pain is not located to a specific location and waxes and wanes in intensity. Pain is described as pressure. She complains of associated neck soreness, nausea, and vomiting that started this morning (2 episodes only this morning). She denies any sinus pain/pressure, runny nose, lacrimation, ear full, numbness/tingling of extremities, gait abnormalities, visual changes. She denies any recent trauma or any history of brain abnormalities in personal or family history. She describes headache as the "worst headache of her life".   She does state that she did a "[place] fasting diet" from 3/10 - 3/13 which consisted of drinking special shakes and broth drinks. Since then she has returned to her normal diet and has had multiple full meals.   Review of Systems  Constitutional: Negative for activity change, appetite change, chills, diaphoresis and fever.  HENT: Negative for congestion, ear discharge, ear pain, sinus pressure, sinus pain, sore throat and tinnitus.   Eyes: Positive for photophobia. Negative for discharge.  Respiratory: Negative for shortness of breath.   Cardiovascular: Negative for chest pain.  Gastrointestinal: Positive for nausea and vomiting. Negative for abdominal pain.  Musculoskeletal: Positive for neck pain and neck stiffness.  Neurological: Positive for headaches. Negative for dizziness, seizures, syncope, speech difficulty, weakness, light-headedness and numbness.       Objective:   Physical Exam  Constitutional: She is oriented to person, place, and time. She appears well-developed and well-nourished.  HENT:  Head: Normocephalic and atraumatic.  Right Ear: External ear normal.  Left Ear: External ear normal.  Eyes:  Conjunctivae and EOM are normal. Pupils are equal, round, and reactive to light.  Neck: Normal range of motion. Neck supple.  Cardiovascular: Normal rate, regular rhythm and normal heart sounds.  Pulmonary/Chest: Effort normal and breath sounds normal. No respiratory distress.  Abdominal: Soft. There is no tenderness. There is no guarding.  Musculoskeletal: Normal range of motion. She exhibits no edema, tenderness or deformity.  Lymphadenopathy:    She has no cervical adenopathy.  Neurological: She is alert and oriented to person, place, and time. No cranial nerve deficit. Coordination normal.  Skin: Skin is warm and dry. No erythema.  Psychiatric: She has a normal mood and affect. Her behavior is normal.          Assessment & Plan:  Marland KitchenMarland KitchenDiagnoses and all orders for this visit:  New daily persistent headache -     CT Head Wo Contrast  Status migrainosus -     dexamethasone (DECADRON) injection 4 mg  Non-intractable vomiting with nausea, unspecified vomiting type -     ondansetron (ZOFRAN) 4 MG tablet; Take 1 tablet (4 mg total) by mouth every 8 (eight) hours as needed for nausea or vomiting.   Pt describes headache as worst of her life. There is no clear etiology of headache today. No neuro deficits. Due to presenting with entire head pain will get CT scan. This could be an atypical migraine. Will not give toradol due to increased bleeding risk before CT scan. We only had 4mg  of dexamethasone in office;therefore, we can her a substandard dose for treatment of migraine. zofran given for N/V.   If CT normal will continue to treat for migraines.   CT was normal.  Pt was called. She will up date [place] on headache in the morning. She can take NSAIDs as needed now.   Marland Kitchen.Spent 30 minutes with patient and greater than 50 percent of visit spent counseling patient regarding treatment plan.

## 2017-05-31 NOTE — Patient Instructions (Addendum)
Get stat CT.  zofran for nausea.

## 2019-01-29 LAB — HM MAMMOGRAPHY

## 2020-07-10 ENCOUNTER — Encounter (HOSPITAL_COMMUNITY): Payer: Self-pay | Admitting: Emergency Medicine

## 2020-07-10 ENCOUNTER — Other Ambulatory Visit: Payer: Self-pay

## 2020-07-10 ENCOUNTER — Inpatient Hospital Stay (HOSPITAL_COMMUNITY)
Admission: EM | Admit: 2020-07-10 | Discharge: 2020-07-25 | DRG: 329 | Disposition: A | Discharge status: Home Health | Payer: No Typology Code available for payment source | Attending: General Surgery | Admitting: General Surgery

## 2020-07-10 ENCOUNTER — Emergency Department (HOSPITAL_COMMUNITY): Payer: No Typology Code available for payment source

## 2020-07-10 DIAGNOSIS — Z818 Family history of other mental and behavioral disorders: Secondary | ICD-10-CM

## 2020-07-10 DIAGNOSIS — Z9109 Other allergy status, other than to drugs and biological substances: Secondary | ICD-10-CM

## 2020-07-10 DIAGNOSIS — Z419 Encounter for procedure for purposes other than remedying health state, unspecified: Secondary | ICD-10-CM

## 2020-07-10 DIAGNOSIS — F17210 Nicotine dependence, cigarettes, uncomplicated: Secondary | ICD-10-CM | POA: Diagnosis present

## 2020-07-10 DIAGNOSIS — R630 Anorexia: Secondary | ICD-10-CM | POA: Diagnosis present

## 2020-07-10 DIAGNOSIS — Z6841 Body Mass Index (BMI) 40.0 and over, adult: Secondary | ICD-10-CM

## 2020-07-10 DIAGNOSIS — Z8249 Family history of ischemic heart disease and other diseases of the circulatory system: Secondary | ICD-10-CM

## 2020-07-10 DIAGNOSIS — F1290 Cannabis use, unspecified, uncomplicated: Secondary | ICD-10-CM | POA: Diagnosis present

## 2020-07-10 DIAGNOSIS — N3289 Other specified disorders of bladder: Secondary | ICD-10-CM | POA: Diagnosis present

## 2020-07-10 DIAGNOSIS — Z7901 Long term (current) use of anticoagulants: Secondary | ICD-10-CM | POA: Diagnosis not present

## 2020-07-10 DIAGNOSIS — K219 Gastro-esophageal reflux disease without esophagitis: Secondary | ICD-10-CM | POA: Diagnosis present

## 2020-07-10 DIAGNOSIS — B379 Candidiasis, unspecified: Secondary | ICD-10-CM | POA: Diagnosis present

## 2020-07-10 DIAGNOSIS — K630 Abscess of intestine: Secondary | ICD-10-CM | POA: Diagnosis not present

## 2020-07-10 DIAGNOSIS — Z20822 Contact with and (suspected) exposure to covid-19: Secondary | ICD-10-CM | POA: Diagnosis present

## 2020-07-10 DIAGNOSIS — Z79899 Other long term (current) drug therapy: Secondary | ICD-10-CM

## 2020-07-10 DIAGNOSIS — R4583 Excessive crying of child, adolescent or adult: Secondary | ICD-10-CM | POA: Diagnosis not present

## 2020-07-10 DIAGNOSIS — R112 Nausea with vomiting, unspecified: Secondary | ICD-10-CM | POA: Diagnosis present

## 2020-07-10 DIAGNOSIS — Z833 Family history of diabetes mellitus: Secondary | ICD-10-CM

## 2020-07-10 DIAGNOSIS — B9620 Unspecified Escherichia coli [E. coli] as the cause of diseases classified elsewhere: Secondary | ICD-10-CM | POA: Diagnosis present

## 2020-07-10 DIAGNOSIS — K5720 Diverticulitis of large intestine with perforation and abscess without bleeding: Secondary | ICD-10-CM | POA: Diagnosis present

## 2020-07-10 DIAGNOSIS — Z1639 Resistance to other specified antimicrobial drug: Secondary | ICD-10-CM | POA: Diagnosis present

## 2020-07-10 DIAGNOSIS — K651 Peritoneal abscess: Secondary | ICD-10-CM | POA: Diagnosis present

## 2020-07-10 DIAGNOSIS — N739 Female pelvic inflammatory disease, unspecified: Secondary | ICD-10-CM | POA: Diagnosis present

## 2020-07-10 DIAGNOSIS — R197 Diarrhea, unspecified: Secondary | ICD-10-CM | POA: Diagnosis present

## 2020-07-10 DIAGNOSIS — K5721 Diverticulitis of large intestine with perforation and abscess with bleeding: Secondary | ICD-10-CM | POA: Diagnosis not present

## 2020-07-10 DIAGNOSIS — K5780 Diverticulitis of intestine, part unspecified, with perforation and abscess without bleeding: Secondary | ICD-10-CM | POA: Diagnosis present

## 2020-07-10 DIAGNOSIS — F430 Acute stress reaction: Secondary | ICD-10-CM | POA: Diagnosis not present

## 2020-07-10 DIAGNOSIS — Z933 Colostomy status: Secondary | ICD-10-CM

## 2020-07-10 LAB — COMPREHENSIVE METABOLIC PANEL
ALT: 17 U/L (ref 0–44)
AST: 14 U/L — ABNORMAL LOW (ref 15–41)
Albumin: 3.2 g/dL — ABNORMAL LOW (ref 3.5–5.0)
Alkaline Phosphatase: 54 U/L (ref 38–126)
Anion gap: 10 (ref 5–15)
BUN: 6 mg/dL (ref 6–20)
CO2: 22 mmol/L (ref 22–32)
Calcium: 8.9 mg/dL (ref 8.9–10.3)
Chloride: 102 mmol/L (ref 98–111)
Creatinine, Ser: 0.95 mg/dL (ref 0.44–1.00)
GFR, Estimated: >60 mL/min (ref >60)
Glucose, Bld: 107 mg/dL — ABNORMAL HIGH (ref 70–99)
Potassium: 3.9 mmol/L (ref 3.5–5.1)
Sodium: 134 mmol/L — ABNORMAL LOW (ref 135–145)
Total Bilirubin: 0.6 mg/dL (ref 0.3–1.2)
Total Protein: 7.3 g/dL (ref 6.5–8.1)

## 2020-07-10 LAB — URINALYSIS, ROUTINE W REFLEX MICROSCOPIC
Bacteria, UA: NONE SEEN
Bilirubin Urine: NEGATIVE
Glucose, UA: NEGATIVE mg/dL
Ketones, ur: NEGATIVE mg/dL
Nitrite: NEGATIVE
Protein, ur: 30 mg/dL — AB
Specific Gravity, Urine: 1.019 (ref 1.005–1.030)
pH: 5 (ref 5.0–8.0)

## 2020-07-10 LAB — CBC
HCT: 40.9 % (ref 36.0–46.0)
Hemoglobin: 13.5 g/dL (ref 12.0–15.0)
MCH: 28.5 pg (ref 26.0–34.0)
MCHC: 33 g/dL (ref 30.0–36.0)
MCV: 86.5 fL (ref 80.0–100.0)
Platelets: 474 10*3/uL — ABNORMAL HIGH (ref 150–400)
RBC: 4.73 MIL/uL (ref 3.87–5.11)
RDW: 13.1 % (ref 11.5–15.5)
WBC: 19 10*3/uL — ABNORMAL HIGH (ref 4.0–10.5)
nRBC: 0 % (ref 0.0–0.2)

## 2020-07-10 LAB — I-STAT BETA HCG BLOOD, ED (MC, WL, AP ONLY): I-stat hCG, quantitative: <5 m[IU]/mL (ref <5)

## 2020-07-10 LAB — SARS CORONAVIRUS 2 (TAT 6-24 HRS): SARS Coronavirus 2: NEGATIVE

## 2020-07-10 LAB — LIPASE, BLOOD: Lipase: 29 U/L (ref 11–51)

## 2020-07-10 MED ORDER — KCL IN DEXTROSE-NACL 10-5-0.45 MEQ/L-%-% IV SOLN
INTRAVENOUS | Status: DC
  Filled 2020-07-10 (×6): qty 1000

## 2020-07-10 MED ORDER — PIPERACILLIN-TAZOBACTAM 3.375 G IVPB 30 MIN
3.3750 g | Freq: Once | INTRAVENOUS | Status: AC
  Administered 2020-07-10: 3.375 g via INTRAVENOUS
  Filled 2020-07-10: qty 50

## 2020-07-10 MED ORDER — METOCLOPRAMIDE HCL 5 MG/ML IJ SOLN
10.0000 mg | Freq: Once | INTRAMUSCULAR | Status: AC
Start: 2020-07-10 — End: 2020-07-10
  Administered 2020-07-10: 10 mg via INTRAVENOUS
  Filled 2020-07-10: qty 2

## 2020-07-10 MED ORDER — ACETAMINOPHEN 500 MG PO TABS
1000.0000 mg | ORAL_TABLET | Freq: Four times a day (QID) | ORAL | Status: DC | PRN
  Administered 2020-07-10 – 2020-07-18 (×2): 1000 mg via ORAL
  Filled 2020-07-10 (×2): qty 2

## 2020-07-10 MED ORDER — ENOXAPARIN SODIUM 40 MG/0.4ML IJ SOSY
40.0000 mg | PREFILLED_SYRINGE | INTRAMUSCULAR | Status: DC
  Administered 2020-07-10 – 2020-07-16 (×7): 40 mg via SUBCUTANEOUS
  Filled 2020-07-10 (×7): qty 0.4

## 2020-07-10 MED ORDER — SODIUM CHLORIDE 0.9 % IV BOLUS
1000.0000 mL | Freq: Once | INTRAVENOUS | Status: AC
Start: 2020-07-10 — End: 2020-07-10
  Administered 2020-07-10: 1000 mL via INTRAVENOUS

## 2020-07-10 MED ORDER — DIPHENHYDRAMINE HCL 50 MG/ML IJ SOLN: 25.0000 mg | Freq: Four times a day (QID) | INTRAMUSCULAR | Status: DC | PRN

## 2020-07-10 MED ORDER — ONDANSETRON HCL 4 MG/2ML IJ SOLN
4.0000 mg | Freq: Four times a day (QID) | INTRAMUSCULAR | Status: DC | PRN
  Administered 2020-07-10 – 2020-07-24 (×16): 4 mg via INTRAVENOUS
  Filled 2020-07-10 (×16): qty 2

## 2020-07-10 MED ORDER — HYDRALAZINE HCL 20 MG/ML IJ SOLN: 10.0000 mg | INTRAMUSCULAR | Status: DC | PRN

## 2020-07-10 MED ORDER — DIPHENHYDRAMINE HCL 25 MG PO CAPS: 25.0000 mg | ORAL_CAPSULE | Freq: Four times a day (QID) | ORAL | Status: DC | PRN

## 2020-07-10 MED ORDER — PIPERACILLIN-TAZOBACTAM 3.375 G IVPB
3.3750 g | Freq: Three times a day (TID) | INTRAVENOUS | Status: DC
  Administered 2020-07-10 – 2020-07-20 (×30): 3.375 g via INTRAVENOUS
  Filled 2020-07-10 (×33): qty 50

## 2020-07-10 MED ORDER — IOHEXOL 300 MG/ML  SOLN
100.0000 mL | Freq: Once | INTRAMUSCULAR | Status: AC | PRN
  Administered 2020-07-10: 100 mL via INTRAVENOUS

## 2020-07-10 MED ORDER — MORPHINE SULFATE (PF) 4 MG/ML IV SOLN
4.0000 mg | INTRAVENOUS | Status: DC | PRN
  Administered 2020-07-10 – 2020-07-11 (×3): 4 mg via INTRAVENOUS
  Filled 2020-07-10 (×3): qty 1

## 2020-07-10 MED ORDER — ONDANSETRON 4 MG PO TBDP: 4.0000 mg | ORAL_TABLET | Freq: Four times a day (QID) | ORAL | Status: DC | PRN

## 2020-07-10 MED ORDER — SODIUM CHLORIDE 0.9 % IV SOLN: INTRAVENOUS | Status: DC

## 2020-07-10 NOTE — ED Triage Notes (Signed)
Pt states she started having abd pain a month ago- March 30th pts colon ruptured and was admitted. Pt states she did not have surgery, but received antibiotics. Followed up with surgeon after being discharged. Pt states she developed a yeast infection two weeks ago, UTI over the weekend. Pt started on Flagyl. Pt now complains of abd starting again and having loose stools. Pt has been vomiting for a day. Pt has hx of diverticulitis.

## 2020-07-10 NOTE — ED Notes (Signed)
MD states to hold off on Zosyn until surgery sees pt.

## 2020-07-10 NOTE — ED Provider Notes (Signed)
[facility] EMERGENCY DEPARTMENT Provider Note   CSN: [id number] Arrival date & time: 07/10/20  0756     History Chief Complaint  Patient presents with  . Abdominal Pain    Kristin Sawyer is a 40 y.o. female.  HPI Patient with multiple medical issues including recent notable episode of perforated diverticulitis now presents with nausea, weakness, anorexia, vomiting and diarrhea. Patient has no history of abdominal surgery.  She does have a history of diverticulitis.  1 month ago patient had abdominal pain, was seen and evaluated at another facility.  She was diagnosed with perforated diverticulitis.  She had a 6-day hospital stay with IV antibiotics.  She notes that on discharge she had improved, continued antibiotics.  Her interval history includes development of a yeast infection and urinary tract infection.  She has subsequently completed Diflucan and additional antibiotics.  With past 3 to 4 days patient has had worsening weakness, nausea, anorexia.  She has had multiple episodes of vomiting, and continues to have loose stool.  She does note, however, that her pain has improved.  No relief with anything.    Past Medical History:  Diagnosis Date  . B12 nutritional deficiency   . Condyloma   . Diverticulitis   . History of marijuana use    pre-pregnancy  . History of trichomoniasis 2015  . HPV in female   . Hx of abnormal cervical Papanicolaou smear   . Hx of adenoidectomy   . Hx of tonsillectomy   . Ovarian cyst     Patient Active Problem List   Diagnosis Date Noted  . Diverticulitis of intestine with abscess 07/10/2020  . New daily persistent headache 05/31/2017  . Status migrainosus 05/31/2017  . LGSIL on Pap smear of cervix 12/30/2016  . Low iron stores 06/15/2016  . Pernicious anemia 06/15/2016  . Vitamin D deficiency 06/15/2016  . Malnutrition of mild degree ([place]) 06/15/2016  . No energy 06/15/2016  . Depression, recurrent ([place]) 06/15/2016   . Low back pain 06/15/2016  . Morbid obesity ([place]) 06/15/2016  . Active labor 06/29/2014  . Spontaneous vaginal delivery 06/29/2014    Past Surgical History:  Procedure Laterality Date  . TONSILLECTOMY       OB History    Gravida  1   Para  1   Term  1   Preterm      AB      Living  1     SAB      IAB      Ectopic      Multiple  0   Live Births  1           Family History  Problem Relation Age of Onset  . Diabetes Mother   . Diabetes Father   . Heart disease Father   . Diabetes Maternal Grandmother   . Depression Sister   . Cancer Maternal Aunt   . Alcohol abuse Maternal Uncle     Social History   Tobacco Use  . Smoking status: Current Every Day Smoker    Packs/day: 1.00  . Smokeless tobacco: Never Used  Substance Use Topics  . Alcohol use: No  . Drug use: No    Home Medications Prior to Admission medications   Medication Sig Start Date End Date Taking? Authorizing Provider  metroNIDAZOLE (FLAGYL) 500 MG tablet Take 500 mg by mouth 2 (two) times daily.   Yes [provider]  Multiple Vitamin (MULTIVITAMIN ADULT PO) Take 1 tablet by mouth  daily.   Yes [provider]  Probiotic Product (PROBIOTIC PO) Take 1 tablet by mouth daily.   Yes [provider]  meloxicam (MOBIC) 15 MG tablet TAKE 1 TABLET (15 MG TOTAL) BY MOUTH DAILY. DUE FOR FOLLOW UP VISIT Patient not taking: No sig reported 11/08/16   Breeback, Jade L, PA-C  ondansetron (ZOFRAN) 4 MG tablet Take 1 tablet (4 mg total) by mouth every 8 (eight) hours as needed for nausea or vomiting. Patient not taking: Reported on 07/10/2020 05/31/17   Donella Stade, PA-C  venlafaxine XR (EFFEXOR-XR) 37.5 MG 24 hr capsule Take 1 capsule (37.5 mg total) by mouth daily with breakfast. Needs appointment for future refills. Patient not taking: No sig reported 08/16/16   Iran Planas L, PA-C    Allergies    Other  Review of Systems   Review of Systems  Constitutional:        Per HPI, otherwise negative  HENT:       Per HPI, otherwise negative  Respiratory:       Per HPI, otherwise negative  Cardiovascular:       Per HPI, otherwise negative  Gastrointestinal: Positive for diarrhea, nausea and vomiting.  Endocrine:       Negative aside from HPI  Genitourinary:       Neg aside from HPI   Musculoskeletal:       Per HPI, otherwise negative  Skin: Negative.   Neurological: Positive for weakness. Negative for syncope.    Physical Exam Updated Vital Signs BP 109/72 (BP Location: Left Arm)   Pulse 87   Temp 99.3 F (37.4 C) (Oral)   Resp 20   Ht 5\' 3"  (1.6 m)   Wt 133.1 kg   SpO2 99%   BMI 51.98 kg/m   Physical Exam Vitals and nursing note reviewed.  Constitutional:      Appearance: She is well-developed. She is obese. She is ill-appearing.  HENT:     Head: Normocephalic and atraumatic.  Eyes:     Conjunctiva/sclera: Conjunctivae normal.  Cardiovascular:     Rate and Rhythm: Normal rate and regular rhythm.  Pulmonary:     Effort: Pulmonary effort is normal. No respiratory distress.     Breath sounds: Normal breath sounds. No stridor.     Comments: Tachypnea Abdominal:     General: There is no distension.     Tenderness: There is no abdominal tenderness.  Skin:    General: Skin is warm and dry.  Neurological:     Mental Status: She is alert and oriented to person, place, and time.     Cranial Nerves: No cranial nerve deficit.     ED Results / Procedures / Treatments   Labs (all labs ordered are listed, but only abnormal results are displayed) Labs Reviewed  COMPREHENSIVE METABOLIC PANEL - Abnormal; Notable for the following components:      Result Value   Sodium 134 (*)    Glucose, Bld 107 (*)    Albumin 3.2 (*)    AST 14 (*)    All other components within normal limits  CBC - Abnormal; Notable for the following components:   WBC 19.0 (*)    Platelets 474 (*)    All other components within normal limits  URINALYSIS, ROUTINE  W REFLEX MICROSCOPIC - Abnormal; Notable for the following components:   Color, Urine AMBER (*)    APPearance HAZY (*)    Hgb urine dipstick MODERATE (*)    Protein, ur  30 (*)    Leukocytes,Ua TRACE (*)    All other components within normal limits  SARS CORONAVIRUS 2 (TAT 6-24 HRS)  LIPASE, BLOOD  HIV ANTIBODY (ROUTINE TESTING W REFLEX)  I-STAT BETA HCG BLOOD, ED ([facility], [facility], [facility] ONLY)    EKG None  Radiology CT ABDOMEN PELVIS W CONTRAST  Result Date: 07/10/2020 CLINICAL DATA:  History of recent perforated sigmoid diverticulitis. Recurrent UTI. Diarrhea. Vomiting. EXAM: CT ABDOMEN AND PELVIS WITH CONTRAST TECHNIQUE: Multidetector CT imaging of the abdomen and pelvis was performed using the standard protocol following bolus administration of intravenous contrast. CONTRAST:  174mL OMNIPAQUE IOHEXOL 300 MG/ML  SOLN COMPARISON:  None. FINDINGS: Lower chest: Solid 4 mm right middle lobe pulmonary nodule (series 4/image 4). Hepatobiliary: Normal liver size. No liver mass. Normal gallbladder with no radiopaque cholelithiasis. No biliary ductal dilatation. Pancreas: Normal, with no mass or duct dilation. Spleen: Normal size. No mass. Adrenals/Urinary Tract: Normal adrenals. Normal kidneys with no hydronephrosis and no renal mass. Nondistended bladder. No bladder gas. Generalized bladder wall thickening, most prominent superiorly. Stomach/Bowel: Small hiatal hernia. Otherwise normal nondistended stomach. Normal caliber small bowel with no small bowel wall thickening. Normal appendix. Moderate sigmoid diverticulosis. Long segment of wall thickening throughout the mid sigmoid colon with associated prominent pericolonic fat stranding. There is a large irregular gas containing phlegmonous collection in the left pelvis measuring approximately 8.5 x 6.7 x 7.8 cm (series 3/image 75), adherent to the sigmoid colon, left adnexal structures, anterior uterus and left superior bladder wall, with heterogeneous internal soft  tissue attenuation. Vascular/Lymphatic: Normal caliber abdominal aorta. Patent portal, splenic, hepatic and renal veins. Shotty nonenlarged retroperitoneal and bilateral common iliac nodes. No pathologically enlarged lymph nodes in the abdomen or pelvis. Reproductive: No right adnexal mass. Other: No ascites. Musculoskeletal: No aggressive appearing focal osseous lesions. Mild lower thoracic spondylosis. IMPRESSION: 1. Moderate sigmoid diverticulosis with long segment of mid sigmoid colon wall thickening. Large irregular gas-containing phlegmonous collection in the left pelvis measuring approximately 8.5 x 6.7 x 7.8 cm, adherent to the sigmoid colon, left adnexal structures, anterior uterus and left superior bladder wall, with heterogeneous internal soft tissue attenuation. Findings are most compatible with the sequela of subacute perforated sigmoid diverticulitis. Surgical consultation recommended at this time. This phlegmonous collection is probably not drainable percutaneously. 2. Generalized bladder wall thickening, most prominent superiorly, probably reactive. No bladder gas to suggest a definite colocutaneous fistula. 3. Small hiatal hernia. 4. Solid 4 mm right middle lobe pulmonary nodule. No follow-up needed if patient is low-risk. Non-contrast chest CT can be considered in 12 months if patient is high-risk. This recommendation follows the consensus statement: Guidelines for Management of Incidental Pulmonary Nodules Detected on CT Images: From the Fleischner Society 2017; Radiology 2017; 284:228-243. Electronically Signed   By: Ilona Sorrel M.D.   On: 07/10/2020 11:04    Procedures Procedures   Medications Ordered in ED Medications  sodium chloride 0.9 % bolus 1,000 mL (0 mLs Intravenous Stopped 07/10/20 1017)    And  0.9 %  sodium chloride infusion ( Intravenous Stopped 07/10/20 1407)  enoxaparin (LOVENOX) injection 40 mg (has no administration in time range)  dextrose 5 % and 0.45 % NaCl with  KCl 10 mEq/L infusion ( Intravenous Rate/Dose Change 07/10/20 1415)  piperacillin-tazobactam (ZOSYN) IVPB 3.375 g (has no administration in time range)  acetaminophen (TYLENOL) tablet 1,000 mg (has no administration in time range)  morphine 4 MG/ML injection 4 mg (4 mg Intravenous Given 07/10/20 1334)  diphenhydrAMINE (BENADRYL) capsule  25 mg (has no administration in time range)    Or  diphenhydrAMINE (BENADRYL) injection 25 mg (has no administration in time range)  ondansetron (ZOFRAN-ODT) disintegrating tablet 4 mg ( Oral See Alternative 07/10/20 1334)    Or  ondansetron (ZOFRAN) injection 4 mg (4 mg Intravenous Given 07/10/20 1334)  hydrALAZINE (APRESOLINE) injection 10 mg (has no administration in time range)  metoCLOPramide (REGLAN) injection 10 mg (10 mg Intravenous Given 07/10/20 0831)  iohexol (OMNIPAQUE) 300 MG/ML solution 100 mL (100 mLs Intravenous Contrast Given 07/10/20 1029)  piperacillin-tazobactam (ZOSYN) IVPB 3.375 g (0 g Intravenous Stopped 07/10/20 1210)    ED Course  I have reviewed the triage vital signs and the nursing notes.  Pertinent labs & imaging results that were available during my care of the patient were reviewed by me and considered in my medical decision making (see chart for details).  On repeat exam the patient is calm, continues to complain of pain. I have reviewed the patient's CT, labs, labs notable for leukocytosis at 19,000, patient is receiving Zofran and Reglan and has received Zosyn for presumed intra-abdominal abscess.  She is aware of all findings, and have discussed her case with our surgery team.  Patient will be admitted for further monitoring, management. Final Clinical Impression(s) / ED Diagnoses Final diagnoses:  Abscess, intestinal  MDM Number of Diagnoses or Management Options Abscess, intestinal: new, needed workup   Amount and/or Complexity of Data Reviewed Clinical lab tests: reviewed and ordered Tests in the radiology section of  CPT: ordered and reviewed Tests in the medicine section of CPT: reviewed and ordered Discuss the patient with other providers: yes Independent visualization of images, tracings, or specimens: yes  Risk of Complications, Morbidity, and/or Mortality Presenting problems: high Diagnostic procedures: high Management options: high  Critical Care Total time providing critical care: < 30 minutes  Patient Progress Patient progress: stable     Carmin Muskrat, MD 07/10/20 1643

## 2020-07-10 NOTE — H&P (Addendum)
[facility] Admission Note  Kristin Sawyer Feb 13, 1981  [id number].    Requesting MD: Vanita Panda, MD Chief Complaint/Reason for Consult: complicated diverticulitis   HPI:  [name] is a 40 y/o F with PMH obesity, tobacco abuse, and diverticulitis who presented to [facility] with a cc progressive fatigue, chills, anorexia, nausea, and loose stools. She was recently admitted at [facility] from 3/30-06/17/20 for diverticulitis with perforation that was managed with IV abx. Patient states after hospital discharge she completed 10 days of PO Augmentin. On Friday 4/22 the patient states she started having chills and poor appetite that waxed and waned but gradually worsened. She reports fatigue and being unable to sit up/stay awake for more than 4 hours at a time due to fatigue. Other associated sxs include poor appetite, nausea without significant vomiting, loose non-loody stools, lower back pain, mild dysuria, and intermittint gas pains. Due to dysuria she started taking some flagyl that she had at home which did not help her sxs. She reports a history of uncomplicated diverticulitis treated with IV antibiotics in [place], [place] about 20 years ago.   Denies a history of colonoscopy. Denies a history of abdominal surgery. Does not have a PCP - states she just gets her mammograms and pap smears. Denies regular medication use, including blood thinners. Reports cutting back on cigarette use but, prior to her hospital admission march, was smoking 1ppd. Reports intermittent marijuana use. Denies other drug use. At baseline she works a full Chief [employer] job, is independent of ADLs, and lives at home with her 101 year-old son. She has mother who lives in [place], [place].   ROS: Review of Systems  Constitutional: Positive for chills and malaise/fatigue.  HENT: Negative.   Eyes: Negative.   Respiratory: Negative.   Cardiovascular: Negative.   Gastrointestinal: Positive for abdominal pain, diarrhea and  nausea. Negative for blood in stool and melena.  Genitourinary: Positive for dysuria. Negative for frequency, hematuria and urgency.  Musculoskeletal: Positive for back pain.  Neurological: Negative.   Endo/Heme/Allergies: Negative.   Psychiatric/Behavioral: Negative.     Family History  Problem Relation Age of Onset  . Diabetes Mother   . Diabetes Father   . Heart disease Father   . Diabetes Maternal Grandmother   . Depression Sister   . Cancer Maternal Aunt   . Alcohol abuse Maternal Uncle     Past Medical History:  Diagnosis Date  . B12 nutritional deficiency   . Condyloma   . Diverticulitis   . History of marijuana use    pre-pregnancy  . History of trichomoniasis 2015  . HPV in female   . Hx of abnormal cervical Papanicolaou smear   . Hx of adenoidectomy   . Hx of tonsillectomy   . Ovarian cyst     Past Surgical History:  Procedure Laterality Date  . TONSILLECTOMY      Social History:  reports that she has been smoking. She has been smoking about 1.00 pack per day. She has never used smokeless tobacco. She reports that she does not drink alcohol and does not use drugs.  Allergies:  Allergies  Allergen Reactions  . Other Other (See Comments)    Rondec DM  Reaction: causes hallucinations    (Not in a hospital admission)   Blood pressure (!) 103/44, pulse 91, temperature 98.6 F (37 C), temperature source Oral, resp. rate (!) 23, height 5\' 3"  (1.6 m), weight 132 kg, SpO2 98 %, unknown if currently breastfeeding. Physical Exam: Constitutional: NAD; conversant; no  deformities Eyes: Moist conjunctiva; no lid lag; anicteric; PERRL Neck: Trachea midline; no thyromegaly Lungs: slightly labored on room air, O2 sats 99%;  CTAB - Mild expiratory wheeze left anterior lung field that cleared with deep inspiration. No crackles. CV: RRR; no palpable thrills; no pitting edema GI: Abd soft, obese, mild tenderness to deep palpation of suprapubic region without guarding;  no palpable hepatosplenomegaly MSK: symmetrical; no clubbing/cyanosis Psychiatric: Appropriate affect; alert and oriented x3 Lymphatic: No palpable cervical or axillary lymphadenopathy  Results for orders placed or performed during the hospital encounter of 07/10/20 (from the past 48 hour(s))  Lipase, blood     Status: None   Collection Time: 07/10/20  8:09 AM  Result Value Ref Range   Lipase 29 11 - 51 U/L    Comment: Performed at [facility], [place] [address], [place], [place] [address]  Comprehensive metabolic panel     Status: Abnormal   Collection Time: 07/10/20  8:09 AM  Result Value Ref Range   Sodium 134 (L) 135 - 145 mmol/L   Potassium 3.9 3.5 - 5.1 mmol/L   Chloride 102 98 - 111 mmol/L   CO2 22 22 - 32 mmol/L   Glucose, Bld 107 (H) 70 - 99 mg/dL    Comment: Glucose reference range applies only to samples taken after fasting for at least 8 hours.   BUN 6 6 - 20 mg/dL   Creatinine, Ser 0.95 0.44 - 1.00 mg/dL   Calcium 8.9 8.9 - 10.3 mg/dL   Total Protein 7.3 6.5 - 8.1 g/dL   Albumin 3.2 (L) 3.5 - 5.0 g/dL   AST 14 (L) 15 - 41 U/L   ALT 17 0 - 44 U/L   Alkaline Phosphatase 54 38 - 126 U/L   Total Bilirubin 0.6 0.3 - 1.2 mg/dL   GFR, Estimated >60 >60 mL/min    Comment: (NOTE) Calculated using the CKD-EPI Creatinine Equation (2021)    Anion gap 10 5 - 15    Comment: Performed at [facility], [place] [address] [place], [place] [address]  CBC     Status: Abnormal   Collection Time: 07/10/20  8:09 AM  Result Value Ref Range   WBC 19.0 (H) 4.0 - 10.5 K/uL   RBC 4.73 3.87 - 5.11 MIL/uL   Hemoglobin 13.5 12.0 - 15.0 g/dL   HCT 40.9 36.0 - 46.0 %   MCV 86.5 80.0 - 100.0 fL   MCH 28.5 26.0 - 34.0 pg   MCHC 33.0 30.0 - 36.0 g/dL   RDW 13.1 11.5 - 15.5 %   Platelets 474 (H) 150 - 400 K/uL   nRBC 0.0 0.0 - 0.2 %    Comment: Performed at [facility], [place] [address] [place], [place] [address]  Urinalysis, Routine w reflex microscopic Urine, Clean Catch      Status: Abnormal   Collection Time: 07/10/20  8:09 AM  Result Value Ref Range   Color, Urine AMBER (A) YELLOW    Comment: BIOCHEMICALS MAY BE AFFECTED BY COLOR   APPearance HAZY (A) CLEAR   Specific Gravity, Urine 1.019 1.005 - 1.030   pH 5.0 5.0 - 8.0   Glucose, UA NEGATIVE NEGATIVE mg/dL   Hgb urine dipstick MODERATE (A) NEGATIVE   Bilirubin Urine NEGATIVE NEGATIVE   Ketones, ur NEGATIVE NEGATIVE mg/dL   Protein, ur 30 (A) NEGATIVE mg/dL   Nitrite NEGATIVE NEGATIVE   Leukocytes,Ua TRACE (A) NEGATIVE   RBC / HPF 6-10 0 -  5 RBC/hpf   WBC, UA 0-5 0 - 5 WBC/hpf   Bacteria, UA NONE SEEN NONE SEEN   Squamous Epithelial / LPF 6-10 0 - 5   Mucus PRESENT    Hyaline Casts, UA PRESENT     Comment: Performed at [facility], [place] [address], [place],  [address]  I-Stat beta hCG blood, ED     Status: None   Collection Time: 07/10/20  8:44 AM  Result Value Ref Range   I-stat hCG, quantitative <5.0 <5 mIU/mL   Comment 3            Comment:   GEST. AGE      CONC.  (mIU/mL)   <=1 WEEK        5 - 50     2 WEEKS       50 - 500     3 WEEKS       100 - 10,000     4 WEEKS     1,000 - 30,000        FEMALE AND NON-PREGNANT FEMALE:     LESS THAN 5 mIU/mL    CT ABDOMEN PELVIS W CONTRAST  Result Date: 07/10/2020 CLINICAL DATA:  History of recent perforated sigmoid diverticulitis. Recurrent UTI. Diarrhea. Vomiting. EXAM: CT ABDOMEN AND PELVIS WITH CONTRAST TECHNIQUE: Multidetector CT imaging of the abdomen and pelvis was performed using the standard protocol following bolus administration of intravenous contrast. CONTRAST:  122mL OMNIPAQUE IOHEXOL 300 MG/ML  SOLN COMPARISON:  None. FINDINGS: Lower chest: Solid 4 mm right middle lobe pulmonary nodule (series 4/image 4). Hepatobiliary: Normal liver size. No liver mass. Normal gallbladder with no radiopaque cholelithiasis. No biliary ductal dilatation. Pancreas: Normal, with no mass or duct dilation. Spleen: Normal size. No mass. Adrenals/Urinary  Tract: Normal adrenals. Normal kidneys with no hydronephrosis and no renal mass. Nondistended bladder. No bladder gas. Generalized bladder wall thickening, most prominent superiorly. Stomach/Bowel: Small hiatal hernia. Otherwise normal nondistended stomach. Normal caliber small bowel with no small bowel wall thickening. Normal appendix. Moderate sigmoid diverticulosis. Long segment of wall thickening throughout the mid sigmoid colon with associated prominent pericolonic fat stranding. There is a large irregular gas containing phlegmonous collection in the left pelvis measuring approximately 8.5 x 6.7 x 7.8 cm (series 3/image 75), adherent to the sigmoid colon, left adnexal structures, anterior uterus and left superior bladder wall, with heterogeneous internal soft tissue attenuation. Vascular/Lymphatic: Normal caliber abdominal aorta. Patent portal, splenic, hepatic and renal veins. Shotty nonenlarged retroperitoneal and bilateral common iliac nodes. No pathologically enlarged lymph nodes in the abdomen or pelvis. Reproductive: No right adnexal mass. Other: No ascites. Musculoskeletal: No aggressive appearing focal osseous lesions. Mild lower thoracic spondylosis. IMPRESSION: 1. Moderate sigmoid diverticulosis with long segment of mid sigmoid colon wall thickening. Large irregular gas-containing phlegmonous collection in the left pelvis measuring approximately 8.5 x 6.7 x 7.8 cm, adherent to the sigmoid colon, left adnexal structures, anterior uterus and left superior bladder wall, with heterogeneous internal soft tissue attenuation. Findings are most compatible with the sequela of subacute perforated sigmoid diverticulitis. Surgical consultation recommended at this time. This phlegmonous collection is probably not drainable percutaneously. 2. Generalized bladder wall thickening, most prominent superiorly, probably reactive. No bladder gas to suggest a definite colocutaneous fistula. 3. Small hiatal hernia. 4. Solid  4 mm right middle lobe pulmonary nodule. No follow-up needed if patient is low-risk. Non-contrast chest CT can be considered in 12 months if patient is high-risk. This recommendation follows the consensus statement:  Guidelines for Management of Incidental Pulmonary Nodules Detected on CT Images: From the Fleischner Society 2017; Radiology 2017; 284:228-243. Electronically Signed   By: Ilona Sorrel M.D.   On: 07/10/2020 11:04    Assessment/Plan Morbid obesity Tobacco abuse - encourage cessation  Sigmoid diverticulitis with perforation and 8.5x6.7x7.8 cm phlegmon  - with recent hospitalization for complicated diverticulitis at [facility] 3/30-06/17/20, treated with IV abx - patient is afebrile, hemodynamically stable, non-toxic appearing - WBC 19,000, BMP unremarkable despite reported poor PO intake - CT abdomen pelvis significant for large phlegmon in the pelvis - this does not appear amenable to IR drainage but I will review the scan with IR to be sure.  - No emergent surgical needs. If fails to clinically improve with IV abx may need an interval CT scan in 3-4 days and IR drain vs operative management.    FEN: IVF @ 75 cc/hr, CLD ID: [email or web address] 4/28 >> VTE: SCD's, Lovenox Foley: none Dispo: admit to inpatient, med-surg, CCS service   Jill Alexanders, [facility] Please see Amion for pager number during day hours 7:00am-4:30pm 07/10/2020, 1:47 PM

## 2020-07-11 LAB — CBC
HCT: 36.5 % (ref 36.0–46.0)
Hemoglobin: 11.9 g/dL — ABNORMAL LOW (ref 12.0–15.0)
MCH: 28.7 pg (ref 26.0–34.0)
MCHC: 32.6 g/dL (ref 30.0–36.0)
MCV: 88.2 fL (ref 80.0–100.0)
Platelets: 407 10*3/uL — ABNORMAL HIGH (ref 150–400)
RBC: 4.14 MIL/uL (ref 3.87–5.11)
RDW: 13.4 % (ref 11.5–15.5)
WBC: 15.1 10*3/uL — ABNORMAL HIGH (ref 4.0–10.5)
nRBC: 0 % (ref 0.0–0.2)

## 2020-07-11 LAB — BASIC METABOLIC PANEL
Anion gap: 9 (ref 5–15)
BUN: <5 mg/dL — ABNORMAL LOW (ref 6–20)
CO2: 26 mmol/L (ref 22–32)
Calcium: 8.5 mg/dL — ABNORMAL LOW (ref 8.9–10.3)
Chloride: 100 mmol/L (ref 98–111)
Creatinine, Ser: 1.02 mg/dL — ABNORMAL HIGH (ref 0.44–1.00)
GFR, Estimated: >60 mL/min (ref >60)
Glucose, Bld: 103 mg/dL — ABNORMAL HIGH (ref 70–99)
Potassium: 3.7 mmol/L (ref 3.5–5.1)
Sodium: 135 mmol/L (ref 135–145)

## 2020-07-11 LAB — HIV ANTIBODY (ROUTINE TESTING W REFLEX): HIV Screen 4th Generation wRfx: NONREACTIVE

## 2020-07-11 MED ORDER — ALUM & MAG HYDROXIDE-SIMETH 200-200-20 MG/5ML PO SUSP
30.0000 mL | Freq: Four times a day (QID) | ORAL | Status: DC | PRN
  Administered 2020-07-11 – 2020-07-18 (×5): 30 mL via ORAL
  Filled 2020-07-11 (×5): qty 30

## 2020-07-11 MED ORDER — HYDROMORPHONE HCL 1 MG/ML IJ SOLN
0.5000 mg | INTRAMUSCULAR | Status: DC | PRN
  Administered 2020-07-11 (×2): 2 mg via INTRAVENOUS
  Administered 2020-07-12 (×2): 1 mg via INTRAVENOUS
  Administered 2020-07-12: 2 mg via INTRAVENOUS
  Administered 2020-07-13: 1 mg via INTRAVENOUS
  Administered 2020-07-13 (×2): 2 mg via INTRAVENOUS
  Administered 2020-07-13 – 2020-07-14 (×2): 1 mg via INTRAVENOUS
  Administered 2020-07-14 – 2020-07-15 (×8): 2 mg via INTRAVENOUS
  Administered 2020-07-16 – 2020-07-17 (×6): 1 mg via INTRAVENOUS
  Filled 2020-07-11 (×3): qty 1
  Filled 2020-07-11 (×2): qty 2
  Filled 2020-07-11: qty 1
  Filled 2020-07-11: qty 2
  Filled 2020-07-11: qty 1
  Filled 2020-07-11: qty 2
  Filled 2020-07-11 (×4): qty 1
  Filled 2020-07-11 (×6): qty 2
  Filled 2020-07-11 (×2): qty 1
  Filled 2020-07-11: qty 2
  Filled 2020-07-11 (×2): qty 1
  Filled 2020-07-11: qty 2
  Filled 2020-07-11: qty 1

## 2020-07-11 NOTE — Progress Notes (Signed)
Progress Note     Subjective: Patient reports mild pain in RLQ, no worse with CLD. Passing flatus and nausea only with the broth.   Objective: Vital signs in last 24 hours: Temp:  [98.1 F (36.7 C)-100 F (37.8 C)] 98.5 F (36.9 C) (04/29 0352) Pulse Rate:  [74-94] 78 (04/29 0846) Resp:  [20-31] 20 (04/29 0352) BP: (100-124)/(44-72) 101/54 (04/29 0846) SpO2:  [93 %-100 %] 100 % (04/29 0352) Weight:  [133.1 kg] 133.1 kg (04/28 1516) Last BM Date: 07/10/20  Intake/Output from previous day: 04/28 0701 - 04/29 0700 In: 2115 [P.O.:240; I.V.:725; IV Piggyback:1150] Out: -  Intake/Output this shift: No intake/output data recorded.  PE: General: pleasant, WD, morbidly obese female who is laying in bed in NAD Heart: regular, rate, and rhythm.   Lungs: CTAB, no wheezes, rhonchi, or rales noted.  Respiratory effort nonlabored Abd: soft, mildly ttp without peritonitis, ND MS: all 4 extremities are symmetrical with no cyanosis, clubbing, or edema. Skin: warm and dry with no masses, lesions, or rashes Neuro: Cranial nerves 2-12 grossly intact, sensation is normal throughout Psych: A&Ox3 with an appropriate affect.    Lab Results:  Recent Labs    07/10/20 0809 07/11/20 0540  WBC 19.0* 15.1*  HGB 13.5 11.9*  HCT 40.9 36.5  PLT 474* 407*   BMET Recent Labs    07/10/20 0809 07/11/20 0540  NA 134* 135  K 3.9 3.7  CL 102 100  CO2 22 26  GLUCOSE 107* 103*  BUN 6 <5*  CREATININE 0.95 1.02*  CALCIUM 8.9 8.5*   PT/INR No results for input(s): LABPROT, INR in the last 72 hours. CMP     Component Value Date/Time   NA 135 07/11/2020 0540   K 3.7 07/11/2020 0540   CL 100 07/11/2020 0540   CO2 26 07/11/2020 0540   GLUCOSE 103 (H) 07/11/2020 0540   BUN <5 (L) 07/11/2020 0540   CREATININE 1.02 (H) 07/11/2020 0540   CREATININE 0.80 06/14/2016 1035   CALCIUM 8.5 (L) 07/11/2020 0540   PROT 7.3 07/10/2020 0809   ALBUMIN 3.2 (L) 07/10/2020 0809   AST 14 (L) 07/10/2020  0809   ALT 17 07/10/2020 0809   ALKPHOS 54 07/10/2020 0809   BILITOT 0.6 07/10/2020 0809   GFRNONAA >60 07/11/2020 0540   Lipase     Component Value Date/Time   LIPASE 29 07/10/2020 0809       Studies/Results: CT ABDOMEN PELVIS W CONTRAST  Result Date: 07/10/2020 CLINICAL DATA:  History of recent perforated sigmoid diverticulitis. Recurrent UTI. Diarrhea. Vomiting. EXAM: CT ABDOMEN AND PELVIS WITH CONTRAST TECHNIQUE: Multidetector CT imaging of the abdomen and pelvis was performed using the standard protocol following bolus administration of intravenous contrast. CONTRAST:  156mL OMNIPAQUE IOHEXOL 300 MG/ML  SOLN COMPARISON:  None. FINDINGS: Lower chest: Solid 4 mm right middle lobe pulmonary nodule (series 4/image 4). Hepatobiliary: Normal liver size. No liver mass. Normal gallbladder with no radiopaque cholelithiasis. No biliary ductal dilatation. Pancreas: Normal, with no mass or duct dilation. Spleen: Normal size. No mass. Adrenals/Urinary Tract: Normal adrenals. Normal kidneys with no hydronephrosis and no renal mass. Nondistended bladder. No bladder gas. Generalized bladder wall thickening, most prominent superiorly. Stomach/Bowel: Small hiatal hernia. Otherwise normal nondistended stomach. Normal caliber small bowel with no small bowel wall thickening. Normal appendix. Moderate sigmoid diverticulosis. Long segment of wall thickening throughout the mid sigmoid colon with associated prominent pericolonic fat stranding. There is a large irregular gas containing phlegmonous collection in the left pelvis  measuring approximately 8.5 x 6.7 x 7.8 cm (series 3/image 75), adherent to the sigmoid colon, left adnexal structures, anterior uterus and left superior bladder wall, with heterogeneous internal soft tissue attenuation. Vascular/Lymphatic: Normal caliber abdominal aorta. Patent portal, splenic, hepatic and renal veins. Shotty nonenlarged retroperitoneal and bilateral common iliac nodes. No  pathologically enlarged lymph nodes in the abdomen or pelvis. Reproductive: No right adnexal mass. Other: No ascites. Musculoskeletal: No aggressive appearing focal osseous lesions. Mild lower thoracic spondylosis. IMPRESSION: 1. Moderate sigmoid diverticulosis with long segment of mid sigmoid colon wall thickening. Large irregular gas-containing phlegmonous collection in the left pelvis measuring approximately 8.5 x 6.7 x 7.8 cm, adherent to the sigmoid colon, left adnexal structures, anterior uterus and left superior bladder wall, with heterogeneous internal soft tissue attenuation. Findings are most compatible with the sequela of subacute perforated sigmoid diverticulitis. Surgical consultation recommended at this time. This phlegmonous collection is probably not drainable percutaneously. 2. Generalized bladder wall thickening, most prominent superiorly, probably reactive. No bladder gas to suggest a definite colocutaneous fistula. 3. Small hiatal hernia. 4. Solid 4 mm right middle lobe pulmonary nodule. No follow-up needed if patient is low-risk. Non-contrast chest CT can be considered in 12 months if patient is high-risk. This recommendation follows the consensus statement: Guidelines for Management of Incidental Pulmonary Nodules Detected on CT Images: From the Fleischner Society 2017; Radiology 2017; 284:228-243. Electronically Signed   By: Ilona Sorrel M.D.   On: 07/10/2020 11:04    Anti-infectives: Anti-infectives (From admission, onward)   Start     Dose/Rate Route Frequency Ordered Stop   07/10/20 1800  piperacillin-tazobactam (ZOSYN) IVPB 3.375 g        3.375 g 12.5 mL/hr over 240 Minutes Intravenous Every 8 hours 07/10/20 1246     07/10/20 1115  piperacillin-tazobactam (ZOSYN) IVPB 3.375 g        3.375 g 100 mL/hr over 30 Minutes Intravenous  Once 07/10/20 1111 07/10/20 1210       Assessment/Plan Morbid obesity Tobacco abuse - encourage cessation  Sigmoid diverticulitis with  perforation and 8.5x6.7x7.8 cm phlegmon  - with recent hospitalization for complicated diverticulitis at [facility] 3/30-06/17/20, treated with IV abx - patient is afebrile, hemodynamically stable, non-toxic appearing - WBC 15.1 from 19.0 - CT abdomen pelvis significant for large phlegmon in the pelvis - this does not appear amenable to IR drainage but I will review the scan with IR to be sure.  - No emergent surgical needs. Likely plan repeat CT in 24-48 hrs    FEN: IVF @ 75 cc/hr, FLD ID: Zosyn 4/28 >> VTE: SCD's, Lovenox Foley: none Dispo: admit to inpatient, med-surg, CCS service   LOS: 1 day    Norm Parcel, [facility] 07/11/2020, 9:08 AM Please see Amion for pager number during day hours 7:00am-4:30pm

## 2020-07-12 LAB — CBC
HCT: 36.2 % (ref 36.0–46.0)
Hemoglobin: 11.8 g/dL — ABNORMAL LOW (ref 12.0–15.0)
MCH: 28.6 pg (ref 26.0–34.0)
MCHC: 32.6 g/dL (ref 30.0–36.0)
MCV: 87.7 fL (ref 80.0–100.0)
Platelets: 405 10*3/uL — ABNORMAL HIGH (ref 150–400)
RBC: 4.13 MIL/uL (ref 3.87–5.11)
RDW: 13.2 % (ref 11.5–15.5)
WBC: 15 10*3/uL — ABNORMAL HIGH (ref 4.0–10.5)
nRBC: 0 % (ref 0.0–0.2)

## 2020-07-12 LAB — BASIC METABOLIC PANEL
Anion gap: 9 (ref 5–15)
BUN: <5 mg/dL — ABNORMAL LOW (ref 6–20)
CO2: 24 mmol/L (ref 22–32)
Calcium: 8.4 mg/dL — ABNORMAL LOW (ref 8.9–10.3)
Chloride: 102 mmol/L (ref 98–111)
Creatinine, Ser: 0.89 mg/dL (ref 0.44–1.00)
GFR, Estimated: >60 mL/min (ref >60)
Glucose, Bld: 99 mg/dL (ref 70–99)
Potassium: 4 mmol/L (ref 3.5–5.1)
Sodium: 135 mmol/L (ref 135–145)

## 2020-07-12 NOTE — Progress Notes (Signed)
   Progress Note     Subjective: Patient reports mild pain in RLQ, no worse with CLD. Passing flatus.  Objective: Vital signs in last 24 hours: Temp:  [98.4 F (36.9 C)-99.3 F (37.4 C)] 98.9 F (37.2 C) (04/30 0414) Pulse Rate:  [75-82] 75 (04/30 0414) Resp:  [16-20] 20 (04/30 0414) BP: (91-111)/(48-59) 102/59 (04/30 0414) SpO2:  [94 %-98 %] 94 % (04/30 0414) Last BM Date: 07/10/20  Intake/Output from previous day: 04/29 0701 - 04/30 0700 In: 290 [P.O.:240; IV Piggyback:50] Out: -  Intake/Output this shift: Total I/O In: 240 [P.O.:240] Out: -   PE: General: pleasant, WD, morbidly obese female who is laying in bed in NAD Heart: regular, rate, and rhythm.   Lungs: CTAB, no wheezes, rhonchi, or rales noted.  Respiratory effort nonlabored Abd: soft, NT, ND, obese, +BS    Lab Results:  Recent Labs    07/11/20 0540 07/12/20 0402  WBC 15.1* 15.0*  HGB 11.9* 11.8*  HCT 36.5 36.2  PLT 407* 405*   BMET Recent Labs    07/11/20 0540 07/12/20 0402  NA 135 135  K 3.7 4.0  CL 100 102  CO2 26 24  GLUCOSE 103* 99  BUN <5* <5*  CREATININE 1.02* 0.89  CALCIUM 8.5* 8.4*   PT/INR No results for input(s): LABPROT, INR in the last 72 hours. CMP     Component Value Date/Time   NA 135 07/12/2020 0402   K 4.0 07/12/2020 0402   CL 102 07/12/2020 0402   CO2 24 07/12/2020 0402   GLUCOSE 99 07/12/2020 0402   BUN <5 (L) 07/12/2020 0402   CREATININE 0.89 07/12/2020 0402   CREATININE 0.80 06/14/2016 1035   CALCIUM 8.4 (L) 07/12/2020 0402   PROT 7.3 07/10/2020 0809   ALBUMIN 3.2 (L) 07/10/2020 0809   AST 14 (L) 07/10/2020 0809   ALT 17 07/10/2020 0809   ALKPHOS 54 07/10/2020 0809   BILITOT 0.6 07/10/2020 0809   GFRNONAA >60 07/12/2020 0402   Lipase     Component Value Date/Time   LIPASE 29 07/10/2020 0809       Studies/Results: No results found.  Anti-infectives: Anti-infectives (From admission, onward)   Start     Dose/Rate Route Frequency Ordered Stop    07/10/20 1800  piperacillin-tazobactam (ZOSYN) IVPB 3.375 g        3.375 g 12.5 mL/hr over 240 Minutes Intravenous Every 8 hours 07/10/20 1246     07/10/20 1115  piperacillin-tazobactam (ZOSYN) IVPB 3.375 g        3.375 g 100 mL/hr over 30 Minutes Intravenous  Once 07/10/20 1111 07/10/20 1210       Assessment/Plan Morbid obesity Tobacco abuse - encourage cessation  Sigmoid diverticulitis with perforation and 8.5x6.7x7.8 cm phlegmon  - with recent hospitalization for complicated diverticulitis at [facility] 3/30-06/17/20, treated with IV abx - patient is afebrile, hemodynamically stable, non-toxic appearing - WBC stable at 15K - CT abdomen pelvis significant for large phlegmon in the pelvis - this does not appear amenable to IR drainage but I will review the scan with IR to be sure.  - repeat CT tomorrow -cont FLD, vs adv given mild pain   FEN: IVF @ 75 cc/hr, FLD ID: Zosyn 4/28 >> VTE: SCD's, Lovenox Foley: none Dispo: admit to inpatient, med-surg, CCS service   LOS: 2 days    Henreitta Cea, [facility] Surgery 07/12/2020, 11:16 AM Please see Amion for pager number during day hours 7:00am-4:30pm

## 2020-07-13 ENCOUNTER — Inpatient Hospital Stay (HOSPITAL_COMMUNITY): Payer: No Typology Code available for payment source

## 2020-07-13 MED ORDER — IOHEXOL 9 MG/ML PO SOLN
ORAL | Status: AC
  Filled 2020-07-13: qty 1000

## 2020-07-13 MED ORDER — IOHEXOL 300 MG/ML  SOLN
100.0000 mL | Freq: Once | INTRAMUSCULAR | Status: AC | PRN
  Administered 2020-07-13: 100 mL via INTRAVENOUS

## 2020-07-13 NOTE — Progress Notes (Signed)
   Progress Note     Subjective: Patient reports mild pain in RLQ and now pain with BMs.  No other new symptoms  Objective: Vital signs in last 24 hours: Temp:  [98.5 F (36.9 C)-99.1 F (37.3 C)] 98.6 F (37 C) (05/01 0423) Pulse Rate:  [71-78] 72 (05/01 0423) Resp:  [18-19] 18 (05/01 0423) BP: (109-120)/(59-79) 116/79 (05/01 0423) SpO2:  [94 %-100 %] 94 % (05/01 0423) Last BM Date: 07/13/20  Intake/Output from previous day: 04/30 0701 - 05/01 0700 In: 2315 [P.O.:1640; I.V.:675] Out: -  Intake/Output this shift: No intake/output data recorded.  PE: General: pleasant, WD, morbidly obese female who is laying in bed in NAD Heart: regular, rate, and rhythm.   Lungs: CTAB, no wheezes, rhonchi, or rales noted.  Respiratory effort nonlabored Abd: soft, NT, ND, obese, +BS    Lab Results:  Recent Labs    07/11/20 0540 07/12/20 0402  WBC 15.1* 15.0*  HGB 11.9* 11.8*  HCT 36.5 36.2  PLT 407* 405*   BMET Recent Labs    07/11/20 0540 07/12/20 0402  NA 135 135  K 3.7 4.0  CL 100 102  CO2 26 24  GLUCOSE 103* 99  BUN <5* <5*  CREATININE 1.02* 0.89  CALCIUM 8.5* 8.4*   PT/INR No results for input(s): LABPROT, INR in the last 72 hours. CMP     Component Value Date/Time   NA 135 07/12/2020 0402   K 4.0 07/12/2020 0402   CL 102 07/12/2020 0402   CO2 24 07/12/2020 0402   GLUCOSE 99 07/12/2020 0402   BUN <5 (L) 07/12/2020 0402   CREATININE 0.89 07/12/2020 0402   CREATININE 0.80 06/14/2016 1035   CALCIUM 8.4 (L) 07/12/2020 0402   PROT 7.3 07/10/2020 0809   ALBUMIN 3.2 (L) 07/10/2020 0809   AST 14 (L) 07/10/2020 0809   ALT 17 07/10/2020 0809   ALKPHOS 54 07/10/2020 0809   BILITOT 0.6 07/10/2020 0809   GFRNONAA >60 07/12/2020 0402   Lipase     Component Value Date/Time   LIPASE 29 07/10/2020 0809       Studies/Results: No results found.  Anti-infectives: Anti-infectives (From admission, onward)   Start     Dose/Rate Route Frequency Ordered Stop    07/10/20 1800  piperacillin-tazobactam (ZOSYN) IVPB 3.375 g        3.375 g 12.5 mL/hr over 240 Minutes Intravenous Every 8 hours 07/10/20 1246     07/10/20 1115  piperacillin-tazobactam (ZOSYN) IVPB 3.375 g        3.375 g 100 mL/hr over 30 Minutes Intravenous  Once 07/10/20 1111 07/10/20 1210       Assessment/Plan Morbid obesity Tobacco abuse - encourage cessation  Sigmoid diverticulitis with perforation and 8.5x6.7x7.8 cm phlegmon  - with recent hospitalization for complicated diverticulitis at [facility] 3/30-06/17/20, treated with IV abx - patient is afebrile, hemodynamically stable, non-toxic appearing - WBC stable at 15K - CT abdomen pelvis significant for large phlegmon in the pelvis - this does not appear amenable to IR drainage but I will review the scan with IR to be sure.  - repeat CT today -soft diet -repeat labs in am   FEN: IVF @ 75 cc/hr, soft diet ID: Zosyn 4/28 >> VTE: SCD's, Lovenox Foley: none   LOS: 3 days    Henreitta Cea, [facility] Surgery 07/13/2020, 10:49 AM Please see Amion for pager number during day hours 7:00am-4:30pm

## 2020-07-14 LAB — CBC
HCT: 36.1 % (ref 36.0–46.0)
Hemoglobin: 11.6 g/dL — ABNORMAL LOW (ref 12.0–15.0)
MCH: 28.4 pg (ref 26.0–34.0)
MCHC: 32.1 g/dL (ref 30.0–36.0)
MCV: 88.3 fL (ref 80.0–100.0)
Platelets: 455 10*3/uL — ABNORMAL HIGH (ref 150–400)
RBC: 4.09 MIL/uL (ref 3.87–5.11)
RDW: 13.2 % (ref 11.5–15.5)
WBC: 11.1 10*3/uL — ABNORMAL HIGH (ref 4.0–10.5)
nRBC: 0 % (ref 0.0–0.2)

## 2020-07-14 LAB — BASIC METABOLIC PANEL
Anion gap: 8 (ref 5–15)
BUN: <5 mg/dL — ABNORMAL LOW (ref 6–20)
CO2: 27 mmol/L (ref 22–32)
Calcium: 8.4 mg/dL — ABNORMAL LOW (ref 8.9–10.3)
Chloride: 101 mmol/L (ref 98–111)
Creatinine, Ser: 1.02 mg/dL — ABNORMAL HIGH (ref 0.44–1.00)
GFR, Estimated: >60 mL/min (ref >60)
Glucose, Bld: 95 mg/dL (ref 70–99)
Potassium: 4.1 mmol/L (ref 3.5–5.1)
Sodium: 136 mmol/L (ref 135–145)

## 2020-07-14 NOTE — Progress Notes (Signed)
Progress Note     Subjective: Patient reported abdominal pain with getting up to the bathroom and having to move around a lot. She is tolerating soft diet and having bowel function. She denies worsened pain with PO intake and denies nausea.  Objective: Vital signs in last 24 hours: Temp:  [98 F (36.7 C)-98.5 F (36.9 C)] 98.3 F (36.8 C) (05/02 1217) Pulse Rate:  [66-79] 75 (05/02 1217) Resp:  [16-19] 16 (05/02 1217) BP: (94-110)/(50-59) 110/53 (05/02 1217) SpO2:  [96 %-98 %] 98 % (05/02 1217) Last BM Date: 07/13/20  Intake/Output from previous day: 05/01 0701 - 05/02 0700 In: 1996.7 [P.O.:400; I.V.:1441.3; IV Piggyback:155.4] Out: -  Intake/Output this shift: Total I/O In: 600 [P.O.:600] Out: -   PE: General: pleasant, WD, morbidly obese female who is laying in bed in NAD Heart: regular, rate, and rhythm.   Lungs: CTAB, no wheezes, rhonchi, or rales noted.  Respiratory effort nonlabored Abd: soft, NT, ND MS: all 4 extremities are symmetrical with no cyanosis, clubbing, or edema. Skin: warm and dry with no masses, lesions, or rashes Neuro: Cranial nerves 2-12 grossly intact, sensation is normal throughout Psych: A&Ox3 with an appropriate affect.    Lab Results:  Recent Labs    07/12/20 0402 07/14/20 0558  WBC 15.0* 11.1*  HGB 11.8* 11.6*  HCT 36.2 36.1  PLT 405* 455*   BMET Recent Labs    07/12/20 0402 07/14/20 0558  NA 135 136  K 4.0 4.1  CL 102 101  CO2 24 27  GLUCOSE 99 95  BUN <5* <5*  CREATININE 0.89 1.02*  CALCIUM 8.4* 8.4*   PT/INR No results for input(s): LABPROT, INR in the last 72 hours. CMP     Component Value Date/Time   NA 136 07/14/2020 0558   K 4.1 07/14/2020 0558   CL 101 07/14/2020 0558   CO2 27 07/14/2020 0558   GLUCOSE 95 07/14/2020 0558   BUN <5 (L) 07/14/2020 0558   CREATININE 1.02 (H) 07/14/2020 0558   CREATININE 0.80 06/14/2016 1035   CALCIUM 8.4 (L) 07/14/2020 0558   PROT 7.3 07/10/2020 0809   ALBUMIN 3.2 (L)  07/10/2020 0809   AST 14 (L) 07/10/2020 0809   ALT 17 07/10/2020 0809   ALKPHOS 54 07/10/2020 0809   BILITOT 0.6 07/10/2020 0809   GFRNONAA >60 07/14/2020 0558   Lipase     Component Value Date/Time   LIPASE 29 07/10/2020 0809       Studies/Results: CT ABDOMEN PELVIS W CONTRAST  Result Date: 07/13/2020 CLINICAL DATA:  Abdominal pain. EXAM: CT ABDOMEN AND PELVIS WITH CONTRAST TECHNIQUE: Multidetector CT imaging of the abdomen and pelvis was performed using the standard protocol following bolus administration of intravenous contrast. CONTRAST:  17mL OMNIPAQUE IOHEXOL 300 MG/ML  SOLN COMPARISON:  July 10, 2020 FINDINGS: Lower chest: A trace amount of atelectasis is seen within the bilateral lung bases. Hepatobiliary: No focal liver abnormality is seen. No gallstones, gallbladder wall thickening, or biliary dilatation. Pancreas: Unremarkable. No pancreatic ductal dilatation or surrounding inflammatory changes. Spleen: Normal in size without focal abnormality. Adrenals/Urinary Tract: Adrenal glands are unremarkable. Kidneys are normal, without renal calculi, focal lesion, or hydronephrosis. Mild urinary bladder wall thickening is seen within the region of the bladder dome with associated inflammatory fat stranding. Stomach/Bowel: Stomach is within normal limits. Appendix appears normal. No evidence of bowel dilatation. Markedly inflamed diverticula are again seen within the proximal and mid sigmoid colon. An adjacent 7.6 cm x 3.6 cm x 7.6  cm abscess is seen containing both fluid and air. This extends to the level of the left adnexa and dome of the urinary bladder and demonstrates no significant interval change in size or appearance when compared to the prior study. Vascular/Lymphatic: No significant vascular findings are present. Multiple subcentimeter para-aortic and aortocaval lymph nodes are seen. Reproductive: The uterus is unremarkable. Other: No abdominal wall hernia or abnormality. No  abdominopelvic ascites. Musculoskeletal: No acute or significant osseous findings. IMPRESSION: 1. Marked severity sigmoid diverticulitis involving the proximal to mid sigmoid colon with a large, predominantly stable associated diverticular abscess. 2. Findings consistent with associated focal cystitis involving the dome of the urinary bladder. Electronically Signed   By: Virgina Norfolk M.D.   On: 07/13/2020 23:49    Anti-infectives: Anti-infectives (From admission, onward)   Start     Dose/Rate Route Frequency Ordered Stop   07/10/20 1800  piperacillin-tazobactam (ZOSYN) IVPB 3.375 g        3.375 g 12.5 mL/hr over 240 Minutes Intravenous Every 8 hours 07/10/20 1246     07/10/20 1115  piperacillin-tazobactam (ZOSYN) IVPB 3.375 g        3.375 g 100 mL/hr over 30 Minutes Intravenous  Once 07/10/20 1111 07/10/20 1210       Assessment/Plan Morbid obesity Tobacco abuse - encourage cessation  Sigmoid diverticulitis with perforation and 8.5x6.7x7.8 cm phlegmon  - with recent hospitalization for complicated diverticulitis at [facility] 3/30-06/17/20, treated with IV abx - patient is afebrile, hemodynamically stable, non-toxic appearing - WBC 11 from 15 - CT abdomen pelvis significant for large phlegmon in the pelvis - this does not appear amenable to IR drainage but I will review the scan with IR to be sure.  - repeat CT 5/1 with stable diverticular abscess - discussed with IR (Dr. Serafina Royals) and no window for percutaneous drainage - will discuss further with MD but patient does not appear to be improving with conservative management, may need surgical intervention which would be a Hartmann's procedure - ok to continue soft diet today, surgery is not emergent     FEN: IVF @ 50 cc/hr, soft diet ID: Zosyn 4/28 >> VTE: SCD's, Lovenox Foley: none  LOS: 4 days    Norm Parcel, [facility] Surgery 07/14/2020, 2:11 PM Please see Amion for pager number during day hours 7:00am-4:30pm

## 2020-07-15 MED ORDER — SODIUM CHLORIDE 0.9% FLUSH: 3.0000 mL | INTRAVENOUS | Status: DC | PRN

## 2020-07-15 MED ORDER — ENSURE ENLIVE PO LIQD
237.0000 mL | Freq: Three times a day (TID) | ORAL | Status: DC
  Administered 2020-07-15 – 2020-07-16 (×5): 237 mL via ORAL

## 2020-07-15 MED ORDER — SODIUM CHLORIDE 0.9% FLUSH
3.0000 mL | Freq: Two times a day (BID) | INTRAVENOUS | Status: DC
  Administered 2020-07-15 – 2020-07-25 (×15): 3 mL via INTRAVENOUS

## 2020-07-15 MED ORDER — SODIUM CHLORIDE 0.9 % IV SOLN: 250.0000 mL | INTRAVENOUS | Status: DC | PRN

## 2020-07-15 NOTE — Progress Notes (Signed)
Progress Note     Subjective: Denies abd pain at rest, with BMs, or with PO intake. Reports one episode of nausea and dry heaves late last night, not associated with PO intake or pain. Endorses some urinary incontinence when her bladder gets too full - not present prior to diverticulitis. Having flatus. Reports 3-4 loose, non-bloody stools daily. Tolerating PO but not taking in much for fear of what will happen - had carrots for dinner, did not eat breakfast, ordered some mashed potatoes and green beans for lunch. Does not eat a lot of meat/fish and baseline.   Objective: Vital signs in last 24 hours: Temp:  [98.3 F (36.8 C)-98.8 F (37.1 C)] 98.6 F (37 C) (05/03 0448) Pulse Rate:  [68-77] 68 (05/03 0448) Resp:  [16-19] 17 (05/03 0448) BP: (96-114)/(53-74) 96/55 (05/03 0448) SpO2:  [94 %-100 %] 97 % (05/03 0448) Last BM Date: 07/14/20  Intake/Output from previous day: 05/02 0701 - 05/03 0700 In: 80 [P.O.:960] Out: -  Intake/Output this shift: Total I/O In: 39.2 [I.V.:39.2] Out: -   PE: General: pleasant, WD, morbidly obese female who is laying in bed in NAD Heart: regular, rate, and rhythm.   Lungs: CTAB, no wheezes, rhonchi, or rales noted.  Respiratory effort nonlabored Abd: soft, mild discomfort with deep palpation of suprapubic region, otherwise non-tender. MS: all 4 extremities are symmetrical with no cyanosis, clubbing, or edema. Skin: warm and dry with no masses, lesions, or rashes Neuro: Cranial nerves 2-12 grossly intact, sensation is normal throughout Psych: A&Ox3 with an appropriate affect.    Lab Results:  Recent Labs    07/14/20 0558  WBC 11.1*  HGB 11.6*  HCT 36.1  PLT 455*   BMET Recent Labs    07/14/20 0558  NA 136  K 4.1  CL 101  CO2 27  GLUCOSE 95  BUN <5*  CREATININE 1.02*  CALCIUM 8.4*   PT/INR No results for input(s): LABPROT, INR in the last 72 hours. CMP     Component Value Date/Time   NA 136 07/14/2020 0558   K 4.1  07/14/2020 0558   CL 101 07/14/2020 0558   CO2 27 07/14/2020 0558   GLUCOSE 95 07/14/2020 0558   BUN <5 (L) 07/14/2020 0558   CREATININE 1.02 (H) 07/14/2020 0558   CREATININE 0.80 06/14/2016 1035   CALCIUM 8.4 (L) 07/14/2020 0558   PROT 7.3 07/10/2020 0809   ALBUMIN 3.2 (L) 07/10/2020 0809   AST 14 (L) 07/10/2020 0809   ALT 17 07/10/2020 0809   ALKPHOS 54 07/10/2020 0809   BILITOT 0.6 07/10/2020 0809   GFRNONAA >60 07/14/2020 0558   Lipase     Component Value Date/Time   LIPASE 29 07/10/2020 0809       Studies/Results: CT ABDOMEN PELVIS W CONTRAST  Result Date: 07/13/2020 CLINICAL DATA:  Abdominal pain. EXAM: CT ABDOMEN AND PELVIS WITH CONTRAST TECHNIQUE: Multidetector CT imaging of the abdomen and pelvis was performed using the standard protocol following bolus administration of intravenous contrast. CONTRAST:  121mL OMNIPAQUE IOHEXOL 300 MG/ML  SOLN COMPARISON:  July 10, 2020 FINDINGS: Lower chest: A trace amount of atelectasis is seen within the bilateral lung bases. Hepatobiliary: No focal liver abnormality is seen. No gallstones, gallbladder wall thickening, or biliary dilatation. Pancreas: Unremarkable. No pancreatic ductal dilatation or surrounding inflammatory changes. Spleen: Normal in size without focal abnormality. Adrenals/Urinary Tract: Adrenal glands are unremarkable. Kidneys are normal, without renal calculi, focal lesion, or hydronephrosis. Mild urinary bladder wall thickening is seen within  the region of the bladder dome with associated inflammatory fat stranding. Stomach/Bowel: Stomach is within normal limits. Appendix appears normal. No evidence of bowel dilatation. Markedly inflamed diverticula are again seen within the proximal and mid sigmoid colon. An adjacent 7.6 cm x 3.6 cm x 7.6 cm abscess is seen containing both fluid and air. This extends to the level of the left adnexa and dome of the urinary bladder and demonstrates no significant interval change in size or  appearance when compared to the prior study. Vascular/Lymphatic: No significant vascular findings are present. Multiple subcentimeter para-aortic and aortocaval lymph nodes are seen. Reproductive: The uterus is unremarkable. Other: No abdominal wall hernia or abnormality. No abdominopelvic ascites. Musculoskeletal: No acute or significant osseous findings. IMPRESSION: 1. Marked severity sigmoid diverticulitis involving the proximal to mid sigmoid colon with a large, predominantly stable associated diverticular abscess. 2. Findings consistent with associated focal cystitis involving the dome of the urinary bladder. Electronically Signed   By: Virgina Norfolk M.D.   On: 07/13/2020 23:49    Anti-infectives: Anti-infectives (From admission, onward)   Start     Dose/Rate Route Frequency Ordered Stop   07/10/20 1800  piperacillin-tazobactam (ZOSYN) IVPB 3.375 g        3.375 g 12.5 mL/hr over 240 Minutes Intravenous Every 8 hours 07/10/20 1246     07/10/20 1115  piperacillin-tazobactam (ZOSYN) IVPB 3.375 g        3.375 g 100 mL/hr over 30 Minutes Intravenous  Once 07/10/20 1111 07/10/20 1210       Assessment/Plan Morbid obesity Tobacco abuse - encourage cessation  Sigmoid diverticulitis with perforation and 8.5x6.7x7.8 cm phlegmon  - with recent hospitalization for complicated diverticulitis at [facility] 3/30-06/17/20, treated with IV abx - WBC 11 yesterday from 15 - repeat CBC in AM along with pre-albumin. - CT abdomen pelvis significant for large phlegmon in the pelvis -  not  amenable to IR drainage - repeat CT 5/1 with stable diverticular abscess - discussed with IR (Dr. Serafina Royals) and no window for percutaneous drainage - patient is afebrile, hemodynamically stable, non-toxic appearing, clinically improving compared to admission -At this point I see two treatment options: check CBC/pre-albumin in the morning and if WBC is going down/improved then transition to PO abx and discharge home with  outpatient follow up in 1-2 weeks OR exploratory laparotomy with partial colectomy/colostomy (Hartmann's procedure). The perioperative morbidity of a Hartmann's procedure on this patient is high given her poor nutrition the last few weeks, morbid obesity, smoking history - she is at risk for post-operative abscess, wound infection, colostomy retraction or necrosis,etc. At this time I would recommend continued attempt at NON-OPERATIVE management, optimizing PO nutrition/protein intake, with the risk that she goes home and this does not resolve on PO abx and she needs surgery in a couple of weeks.   FEN: saline lock IV, soft diet, add TID protein shakes ID: Zosyn 4/28 >> DAY #5 VTE: SCD's, Lovenox Foley: none   LOS: 5 days    Jill Alexanders, [facility] 07/15/2020, 10:09 AM Please see Amion for pager number during day hours 7:00am-4:30pm

## 2020-07-15 NOTE — Progress Notes (Signed)
The patient is still having multiple episodes of emesis and continued pain in the abdomen.

## 2020-07-16 ENCOUNTER — Inpatient Hospital Stay: Payer: Self-pay

## 2020-07-16 LAB — CBC
HCT: 37.1 % (ref 36.0–46.0)
Hemoglobin: 12 g/dL (ref 12.0–15.0)
MCH: 28.4 pg (ref 26.0–34.0)
MCHC: 32.3 g/dL (ref 30.0–36.0)
MCV: 87.9 fL (ref 80.0–100.0)
Platelets: 506 10*3/uL — ABNORMAL HIGH (ref 150–400)
RBC: 4.22 MIL/uL (ref 3.87–5.11)
RDW: 13.5 % (ref 11.5–15.5)
WBC: 13.6 10*3/uL — ABNORMAL HIGH (ref 4.0–10.5)
nRBC: 0 % (ref 0.0–0.2)

## 2020-07-16 LAB — PREALBUMIN: Prealbumin: 7.4 mg/dL — ABNORMAL LOW (ref 18–38)

## 2020-07-16 LAB — URINALYSIS, ROUTINE W REFLEX MICROSCOPIC
Bacteria, UA: NONE SEEN
Bilirubin Urine: NEGATIVE
Glucose, UA: NEGATIVE mg/dL
Ketones, ur: NEGATIVE mg/dL
Leukocytes,Ua: NEGATIVE
Nitrite: NEGATIVE
Protein, ur: NEGATIVE mg/dL
Specific Gravity, Urine: 1.013 (ref 1.005–1.030)
pH: 9 — ABNORMAL HIGH (ref 5.0–8.0)

## 2020-07-16 MED ORDER — CHLORHEXIDINE GLUCONATE CLOTH 2 % EX PADS
6.0000 | MEDICATED_PAD | Freq: Every day | CUTANEOUS | Status: DC
  Administered 2020-07-16 – 2020-07-25 (×9): 6 via TOPICAL

## 2020-07-16 MED ORDER — SODIUM CHLORIDE 0.9% FLUSH
10.0000 mL | INTRAVENOUS | Status: DC | PRN
  Administered 2020-07-17 – 2020-07-19 (×2): 10 mL

## 2020-07-16 MED ORDER — TRAVASOL 10 % IV SOLN
INTRAVENOUS | Status: DC
  Filled 2020-07-16: qty 499.2

## 2020-07-16 MED ORDER — INSULIN ASPART 100 UNIT/ML IJ SOLN
0.0000 [IU] | Freq: Four times a day (QID) | INTRAMUSCULAR | Status: DC
  Administered 2020-07-17: 2 [IU] via SUBCUTANEOUS
  Administered 2020-07-17: 3 [IU] via SUBCUTANEOUS
  Administered 2020-07-18 – 2020-07-20 (×7): 2 [IU] via SUBCUTANEOUS

## 2020-07-16 NOTE — Progress Notes (Signed)
Notified IV team of pt returning to room for TPA.

## 2020-07-16 NOTE — Progress Notes (Signed)
PHARMACY - TOTAL PARENTERAL NUTRITION CONSULT NOTE   Indication: malnutrition in the setting of persistent diverticulitis x 4 weeks and inadequate PO intake  Patient Measurements: Height: 5\' 3"  (160 cm) Weight: 133.1 kg (293 lb 6.9 oz) IBW/kg (Calculated) : 52.4 TPN AdjBW (KG): 72.6 Body mass index is 51.98 kg/m.  Assessment: 53 yof presenting 4/28 with sigmoid diverticulitis with perforation and phlegmon with recent hospitalization treated with IV abx. Surgery does not believe patient will do well on IV abx alone. Patient reports increased intermittent, sharp suprapubic abdominal pain, emesis x 1 overnight, ongoing nonbloody BMs becoming more formed. Plan to start TPN and consider surgery this admission. Patient reportedly with malnutrition x 4 weeks with inadequate PO intake.  Glucose / Insulin: no hx DM. AM BG controlled Electrolytes: last labs from 5/2 - all stable WNL. No Phos/Mag available yet Renal: last labs from 5/2 - SCr 1.02, relatively stable, BUN <5 Hepatic: last labs from 4/28 - LFTs / Tbili WNL. Albumin 3.2 Prealbumin low 7.4 (5/4). No TG available yet Intake / Output; MIVF: I/O's appear inaccurate. Emesis x 1 5/3 PM, ongoing nonbloody BMs - more formed per Surgery GI Imaging: none since TPN start GI Surgeries / Procedures: none since TPN start  Central access: PICC placement planned 5/4 TPN start date: 07/16/20  Nutritional Goals (per RD recommendation on pending): kCal: 1815-1960, Protein: 95-110g, Fluid: 1.6L per day Goal TPN rate is 80 mL/hr (provides 100 g of protein and 1955 kcals per day)  Current Nutrition:  Ensure Enlive TID with meals - all doses charted yesterday Soft diet (50% of 1 meal charted today)  Plan:  Start TPN at 40 mL/hr at 1800. Titrate to goal as appropriate. Will provide 50g protein and 977kCal, meeting ~50% of patient needs. Electrolytes in TPN: Na 62mEq/L, K 69mEq/L, Ca 75mEq/L, Mg 51mEq/L, and Phos 45mmol/L. Cl:Ac 1:1 Add standard MVI and  trace elements to TPN Initiate Moderate q6h SSI and adjust as needed  Monitor TPN labs on Mon/Thurs, f/u Surgery plans   Arturo Morton, PharmD, BCPS Please check AMION for all [place] contact numbers Clinical Pharmacist 07/16/2020 10:20 AM

## 2020-07-16 NOTE — Progress Notes (Signed)
Progress Note     Subjective: Increased, intermittent, sharp suprapubic abd pain last 24h. One episode emesis last night which she attributed to the chocolate ensure not sitting well. Ongoing nonbloody Bms are becoming more formed. Denies fever/chills.   Afebrile, VSS, WBC is up to 13.6 from 11 Prealbumin 7.4   Objective: Vital signs in last 24 hours: Temp:  [98.4 F (36.9 C)-98.6 F (37 C)] 98.4 F (36.9 C) (05/04 0527) Pulse Rate:  [65-74] 65 (05/04 0527) Resp:  [18] 18 (05/04 0527) BP: (107-116)/(43-66) 107/59 (05/04 0527) SpO2:  [94 %-100 %] 94 % (05/04 0527) Last BM Date: 07/14/20  Intake/Output from previous day: 05/03 0701 - 05/04 0700 In: 513 [P.O.:360; I.V.:153] Out: -  Intake/Output this shift: No intake/output data recorded.  PE: General: pleasant, WD, morbidly obese female who is laying in bed in NAD Heart: regular, rate, and rhythm.   Lungs: CTAB, no wheezes, rhonchi, or rales noted.  Respiratory effort nonlabored Abd: soft, mild discomfort with deep palpation of suprapubic region, otherwise non-tender. MS: all 4 extremities are symmetrical with no cyanosis, clubbing, or edema. Skin: warm and dry with no masses, lesions, or rashes Neuro: Cranial nerves 2-12 grossly intact, sensation is normal throughout Psych: A&Ox3 with an appropriate affect.    Lab Results:  Recent Labs    07/14/20 0558 07/16/20 0133  WBC 11.1* 13.6*  HGB 11.6* 12.0  HCT 36.1 37.1  PLT 455* 506*   BMET Recent Labs    07/14/20 0558  NA 136  K 4.1  CL 101  CO2 27  GLUCOSE 95  BUN <5*  CREATININE 1.02*  CALCIUM 8.4*   PT/INR No results for input(s): LABPROT, INR in the last 72 hours. CMP     Component Value Date/Time   NA 136 07/14/2020 0558   K 4.1 07/14/2020 0558   CL 101 07/14/2020 0558   CO2 27 07/14/2020 0558   GLUCOSE 95 07/14/2020 0558   BUN <5 (L) 07/14/2020 0558   CREATININE 1.02 (H) 07/14/2020 0558   CREATININE 0.80 06/14/2016 1035   CALCIUM 8.4 (L)  07/14/2020 0558   PROT 7.3 07/10/2020 0809   ALBUMIN 3.2 (L) 07/10/2020 0809   AST 14 (L) 07/10/2020 0809   ALT 17 07/10/2020 0809   ALKPHOS 54 07/10/2020 0809   BILITOT 0.6 07/10/2020 0809   GFRNONAA >60 07/14/2020 0558   Lipase     Component Value Date/Time   LIPASE 29 07/10/2020 0809       Studies/Results: No results found.  Anti-infectives: Anti-infectives (From admission, onward)   Start     Dose/Rate Route Frequency Ordered Stop   07/10/20 1800  piperacillin-tazobactam (ZOSYN) IVPB 3.375 g        3.375 g 12.5 mL/hr over 240 Minutes Intravenous Every 8 hours 07/10/20 1246     07/10/20 1115  piperacillin-tazobactam (ZOSYN) IVPB 3.375 g        3.375 g 100 mL/hr over 30 Minutes Intravenous  Once 07/10/20 1111 07/10/20 1210       Assessment/Plan Morbid obesity Tobacco abuse - encourage cessation  Sigmoid diverticulitis with perforation and 8.5x6.7x7.8 cm phlegmon  - with recent hospitalization for complicated diverticulitis at [facility] 3/30-06/17/20, treated with IV abx - CT abdomen pelvis significant for large phlegmon in the pelvis -  not  amenable to IR drainage - repeat CT 5/1 with stable diverticular abscess - discussed with IR (Dr. Serafina Royals) and no window for percutaneous drainage - patient is afebrile, hemodynamically stable, non-toxic appearing, but now with  clinical worsening (increased pain, emesis, worsening PO intake), and WBC 13.6 from 11. Prealbumin is 7.  - will discuss surgical plan with MD- unfortunately I do not think this patient will do well at home on antibiotics alone. I think she needs a PICC/TPN and we need to consider surgery this admission vs home with IV abx and close outpatient surgical follow up.    The perioperative morbidity of performing a Hartmann's procedure on this patient is high given her poor nutrition, morbid obesity, smoking history, and active infection/abscess. She is at risk for post-operative abscess, wound infection, stump leak,  colostomy retraction or necrosis,etc. Despite these risks the patient is not showing significant improvement and has already failed outpatient treatment with antibiotics after her last admission, she likely just needs an operation.   FEN: saline lock IV, soft diet, add TID protein shakes ID: Zosyn 4/28 >> DAY #6 VTE: SCD's, Lovenox Foley: none   LOS: 6 days    Jill Alexanders, [facility] 07/16/2020, 8:14 AM Please see Amion for pager number during day hours 7:00am-4:30pm

## 2020-07-16 NOTE — Progress Notes (Signed)
RN reviewed the POC with patient regarding the PICC line and TPN. Patient voices that she needs to speak to PA that seen her this AM due to the fact that she is very unclear of what was discussed. PA. Paged, will come see patient soon. Patient updated.

## 2020-07-16 NOTE — Progress Notes (Signed)
Peripherally Inserted Central Catheter Placement  The IV Nurse has discussed with the patient and/or persons authorized to consent for the patient, the purpose of this procedure and the potential benefits and risks involved with this procedure.  The benefits include less needle sticks, lab draws from the catheter, and the patient may be discharged home with the catheter. Risks include, but not limited to, infection, bleeding, blood clot (thrombus formation), and puncture of an artery; nerve damage and irregular heartbeat and possibility to perform a PICC exchange if needed/ordered by physician.  Alternatives to this procedure were also discussed.  Bard Power PICC patient education guide, fact sheet on infection prevention and patient information card has been provided to patient /or left at bedside.    PICC Placement Documentation  PICC Double Lumen 07/16/20 PICC Right Brachial 37 cm 0 cm (Active)  Indication for Insertion or Continuance of Line Administration of hyperosmolar/irritating solutions (i.e. TPN, Vancomycin, etc.) 07/16/20 1555  Exposed Catheter (cm) 0 cm 07/16/20 1555  Site Assessment Clean;Dry;Intact 07/16/20 1555  Lumen #1 Status Flushed;Blood return noted 07/16/20 1555  Lumen #2 Status Flushed;Blood return noted 07/16/20 1555  Dressing Type Transparent 07/16/20 1555  Dressing Status Clean;Dry;Intact 07/16/20 1555  Antimicrobial disc in place? Yes 07/16/20 1555  Dressing Intervention New dressing;Other (Comment) 07/16/20 1555  Dressing Change Due 07/23/20 07/16/20 1555       Christella Noa Albarece 07/16/2020, 3:56 PM

## 2020-07-16 NOTE — Progress Notes (Signed)
Returned to patient's room after [facility] RN notified of patient return. However, upon [facility] arrival patient refused to be connected to her TNA, requesting to shower first. Messaged nurse and requested a re-consult when patient back to bed and ready to have TNA connect. Fran Lowes, RN [facility]

## 2020-07-16 NOTE — Progress Notes (Signed)
Arrived to patients room to hang TNA. Patient was off unit. Instructed nurse to notify [facility] when patient back on unit. Thanks

## 2020-07-16 NOTE — Progress Notes (Signed)
Initial Nutrition Assessment  DOCUMENTATION CODES:   Morbid obesity  INTERVENTION:    TPN dosing per Pharmacy. Recommend meet ~50% of her nutrition needs to account for what she is not eating PO.   NUTRITION DIAGNOSIS:   Increased nutrient needs related to acute illness (diverticular abscess) as evidenced by estimated needs.  GOAL:   Patient will meet greater than or equal to 90% of their needs  MONITOR:   PO intake,Supplement acceptance  REASON FOR ASSESSMENT:   Consult New TPN/TNA  ASSESSMENT:   40 yo female admitted with sigmoid diverticulitis with perforation and phlegmon. PMH includes obesity, tobacco abuse, diverticulitis, vitamin B-12 deficiency.   Unable to speak with patient or perform nutrition focused physical exam at this time. IV team in room to place PICC. Plans to begin TPN today.   CT on 5/1 showed stable diverticular abscess, no window for percutaneous drainage. May require surgery.   Patient is currently on a soft diet. Meal intakes: 50-100% (average 94% x past 8 meals recorded) Supplements: Ensure Enlive/Plus TID (patient drank 2 today so far) Supper 5/3 100% intake = 371 kcal, 5 gm protein Breakfast 5/4 50% intake = 180 kcal, 9 gm protein Lunch 5/4 100% intake = 450 kcal, 17 gm protein Supplements = 700 kcal, 40 gm protein  Total intake x past 24 hours = 1701 kcal, 71 gm protein  Patient is meeting ~79% of kcal needs and 59% of protein needs  Labs reviewed.  Prealbumin 7.4 (likely low d/t active infection/abscess). Low albumin or prealbumin is no longer used to diagnose malnutrition. These lab values are a poor indicator of nutrition status as they are reflective of inflammatory process, acute stress response, and levels are highly affected (decreased) by volume overload. [place] uses the new malnutrition guidelines published by the American Society for Parenteral and Enteral Nutrition (A.S.P.E.N.) and the Academy of Nutrition and Dietetics  (AND).   Medications reviewed and include novolog.  No weight history available for review.   Diet Order:   Diet Order            Diet NPO time specified  Diet effective midnight           DIET SOFT Room service appropriate? Yes; Fluid consistency: Thin  Diet effective now                 EDUCATION NEEDS:   Not appropriate for education at this time  Skin:  Skin Assessment: Reviewed RN Assessment  Last BM:  5/2  Height:   Ht Readings from Last 1 Encounters:  07/10/20 5\' 3"  (1.6 m)    Weight:   Wt Readings from Last 1 Encounters:  07/10/20 133.1 kg    Ideal Body Weight:  52.3 kg  BMI:  Body mass index is 51.98 kg/m.  Estimated Nutritional Needs:   Kcal:  2150-2350  Protein:  120-135 gm  Fluid:  >/= 2 L    Lucas Mallow, RD, LDN, CNSC Please refer to Amion for contact information.

## 2020-07-16 NOTE — Progress Notes (Addendum)
Patient c/o of vaginal pain associated with pins and needles radiating to her lower back. Patient also noted with frequent urination.  She shared that reported to the PA this AM. PRN med given. Patient independently ambulate in room. Patient voiced that chocolate ensure made her nauseous and throw up. Will try strawberry ensure today. Patient aware to let RN know if this flavor make her sick.  RN spoke with Dr. Bobbye Morton, A. Verbal order for UA at this time.

## 2020-07-17 ENCOUNTER — Encounter (HOSPITAL_COMMUNITY): Payer: Self-pay

## 2020-07-17 ENCOUNTER — Inpatient Hospital Stay (HOSPITAL_COMMUNITY): Payer: No Typology Code available for payment source | Admitting: Certified Registered"

## 2020-07-17 ENCOUNTER — Inpatient Hospital Stay (HOSPITAL_COMMUNITY): Payer: No Typology Code available for payment source

## 2020-07-17 ENCOUNTER — Encounter (HOSPITAL_COMMUNITY): Admission: EM | Disposition: A | Discharge status: Home Health | Payer: Self-pay | Source: Home / Self Care

## 2020-07-17 HISTORY — PX: PARTIAL COLECTOMY: SHX5273

## 2020-07-17 HISTORY — PX: CYSTOSCOPY WITH STENT PLACEMENT: SHX5790

## 2020-07-17 HISTORY — PX: EXPLORATORY LAPAROTOMY: SUR591

## 2020-07-17 LAB — COMPREHENSIVE METABOLIC PANEL
ALT: 19 U/L (ref 0–44)
AST: 20 U/L (ref 15–41)
Albumin: 2.3 g/dL — ABNORMAL LOW (ref 3.5–5.0)
Alkaline Phosphatase: 51 U/L (ref 38–126)
Anion gap: 6 (ref 5–15)
BUN: 9 mg/dL (ref 6–20)
CO2: 27 mmol/L (ref 22–32)
Calcium: 8.8 mg/dL — ABNORMAL LOW (ref 8.9–10.3)
Chloride: 102 mmol/L (ref 98–111)
Creatinine, Ser: 0.97 mg/dL (ref 0.44–1.00)
GFR, Estimated: >60 mL/min (ref >60)
Glucose, Bld: 102 mg/dL — ABNORMAL HIGH (ref 70–99)
Potassium: 4 mmol/L (ref 3.5–5.1)
Sodium: 135 mmol/L (ref 135–145)
Total Bilirubin: 0.2 mg/dL — ABNORMAL LOW (ref 0.3–1.2)
Total Protein: 6 g/dL — ABNORMAL LOW (ref 6.5–8.1)

## 2020-07-17 LAB — GLUCOSE, CAPILLARY
Glucose-Capillary: 106 mg/dL — ABNORMAL HIGH (ref 70–99)
Glucose-Capillary: 120 mg/dL — ABNORMAL HIGH (ref 70–99)
Glucose-Capillary: 145 mg/dL — ABNORMAL HIGH (ref 70–99)
Glucose-Capillary: 189 mg/dL — ABNORMAL HIGH (ref 70–99)
Glucose-Capillary: 98 mg/dL (ref 70–99)

## 2020-07-17 LAB — DIFFERENTIAL
Abs Immature Granulocytes: 0.04 10*3/uL (ref 0.00–0.07)
Basophils Absolute: 0.1 10*3/uL (ref 0.0–0.1)
Basophils Relative: 0 %
Eosinophils Absolute: 0.2 10*3/uL (ref 0.0–0.5)
Eosinophils Relative: 2 %
Immature Granulocytes: 0 %
Lymphocytes Relative: 25 %
Lymphs Abs: 2.8 10*3/uL (ref 0.7–4.0)
Monocytes Absolute: 0.6 10*3/uL (ref 0.1–1.0)
Monocytes Relative: 6 %
Neutro Abs: 7.6 10*3/uL (ref 1.7–7.7)
Neutrophils Relative %: 67 %

## 2020-07-17 LAB — TYPE AND SCREEN
ABO/RH(D): A POS
Antibody Screen: NEGATIVE

## 2020-07-17 LAB — TRIGLYCERIDES: Triglycerides: 152 mg/dL — ABNORMAL HIGH (ref <150)

## 2020-07-17 LAB — PHOSPHORUS: Phosphorus: 5.2 mg/dL — ABNORMAL HIGH (ref 2.5–4.6)

## 2020-07-17 LAB — CBC
HCT: 35.7 % — ABNORMAL LOW (ref 36.0–46.0)
Hemoglobin: 11.6 g/dL — ABNORMAL LOW (ref 12.0–15.0)
MCH: 29.1 pg (ref 26.0–34.0)
MCHC: 32.5 g/dL (ref 30.0–36.0)
MCV: 89.7 fL (ref 80.0–100.0)
Platelets: 446 10*3/uL — ABNORMAL HIGH (ref 150–400)
RBC: 3.98 MIL/uL (ref 3.87–5.11)
RDW: 13.4 % (ref 11.5–15.5)
WBC: 11.3 10*3/uL — ABNORMAL HIGH (ref 4.0–10.5)
nRBC: 0 % (ref 0.0–0.2)

## 2020-07-17 LAB — MAGNESIUM: Magnesium: 2.2 mg/dL (ref 1.7–2.4)

## 2020-07-17 LAB — SURGICAL PCR SCREEN
MRSA, PCR: NEGATIVE
Staphylococcus aureus: NEGATIVE

## 2020-07-17 LAB — PREALBUMIN: Prealbumin: 7.2 mg/dL — ABNORMAL LOW (ref 18–38)

## 2020-07-17 SURGERY — COLECTOMY, PARTIAL
Anesthesia: General | Site: Ureter

## 2020-07-17 MED ORDER — MIDAZOLAM HCL 2 MG/2ML IJ SOLN
INTRAMUSCULAR | Status: AC
  Filled 2020-07-17: qty 2

## 2020-07-17 MED ORDER — PIPERACILLIN-TAZOBACTAM 3.375 G IVPB
3.3750 g | INTRAVENOUS | Status: DC
  Filled 2020-07-17: qty 50

## 2020-07-17 MED ORDER — ALBUMIN HUMAN 5 % IV SOLN: INTRAVENOUS | Status: DC | PRN

## 2020-07-17 MED ORDER — METHOCARBAMOL 1000 MG/10ML IJ SOLN
1000.0000 mg | Freq: Three times a day (TID) | INTRAVENOUS | Status: DC
  Administered 2020-07-17 – 2020-07-18 (×3): 1000 mg via INTRAVENOUS
  Filled 2020-07-17 (×7): qty 10

## 2020-07-17 MED ORDER — FENTANYL CITRATE (PF) 100 MCG/2ML IJ SOLN
25.0000 ug | INTRAMUSCULAR | Status: DC | PRN
  Administered 2020-07-17 (×2): 50 ug via INTRAVENOUS

## 2020-07-17 MED ORDER — LACTATED RINGERS IV SOLN: INTRAVENOUS | Status: DC

## 2020-07-17 MED ORDER — OXYCODONE HCL 5 MG PO TABS
5.0000 mg | ORAL_TABLET | ORAL | Status: DC | PRN
  Administered 2020-07-17 – 2020-07-18 (×2): 10 mg via ORAL
  Filled 2020-07-17: qty 2
  Filled 2020-07-17: qty 1
  Filled 2020-07-17: qty 2

## 2020-07-17 MED ORDER — LIDOCAINE 2% (20 MG/ML) 5 ML SYRINGE
INTRAMUSCULAR | Status: DC | PRN
  Administered 2020-07-17: 40 mg via INTRAVENOUS

## 2020-07-17 MED ORDER — ENOXAPARIN SODIUM 40 MG/0.4ML IJ SOSY
40.0000 mg | PREFILLED_SYRINGE | INTRAMUSCULAR | Status: DC
  Administered 2020-07-18 – 2020-07-23 (×6): 40 mg via SUBCUTANEOUS
  Filled 2020-07-17 (×8): qty 0.4

## 2020-07-17 MED ORDER — HYDROMORPHONE HCL 1 MG/ML IJ SOLN
0.5000 mg | INTRAMUSCULAR | Status: DC | PRN
  Administered 2020-07-17 – 2020-07-19 (×16): 1 mg via INTRAVENOUS
  Filled 2020-07-17 (×16): qty 1

## 2020-07-17 MED ORDER — FENTANYL CITRATE (PF) 250 MCG/5ML IJ SOLN
INTRAMUSCULAR | Status: DC | PRN
  Administered 2020-07-17 (×4): 50 ug via INTRAVENOUS
  Administered 2020-07-17: 125 ug via INTRAVENOUS
  Administered 2020-07-17: 50 ug via INTRAVENOUS
  Administered 2020-07-17: 75 ug via INTRAVENOUS
  Administered 2020-07-17: 50 ug via INTRAVENOUS

## 2020-07-17 MED ORDER — PROPOFOL 10 MG/ML IV BOLUS
INTRAVENOUS | Status: AC
  Filled 2020-07-17: qty 20

## 2020-07-17 MED ORDER — TRAVASOL 10 % IV SOLN
INTRAVENOUS | Status: AC
  Filled 2020-07-17: qty 1209.6

## 2020-07-17 MED ORDER — OXYCODONE HCL 5 MG PO TABS: 5.0000 mg | ORAL_TABLET | Freq: Once | ORAL | Status: DC | PRN

## 2020-07-17 MED ORDER — DEXMEDETOMIDINE HCL IN NACL 80 MCG/20ML IV SOLN
INTRAVENOUS | Status: AC
  Filled 2020-07-17: qty 20

## 2020-07-17 MED ORDER — OXYCODONE HCL 5 MG/5ML PO SOLN: 5.0000 mg | Freq: Once | ORAL | Status: DC | PRN

## 2020-07-17 MED ORDER — CHLORHEXIDINE GLUCONATE 0.12 % MT SOLN
OROMUCOSAL | Status: AC
  Administered 2020-07-17: 15 mL via OROMUCOSAL
  Filled 2020-07-17: qty 15

## 2020-07-17 MED ORDER — SUGAMMADEX SODIUM 200 MG/2ML IV SOLN
INTRAVENOUS | Status: DC | PRN
  Administered 2020-07-17: 250 mg via INTRAVENOUS

## 2020-07-17 MED ORDER — LACTATED RINGERS IV SOLN: INTRAVENOUS | Status: DC | PRN

## 2020-07-17 MED ORDER — 0.9 % SODIUM CHLORIDE (POUR BTL) OPTIME
TOPICAL | Status: DC | PRN
  Administered 2020-07-17 (×2): 1000 mL

## 2020-07-17 MED ORDER — FENTANYL CITRATE (PF) 250 MCG/5ML IJ SOLN
INTRAMUSCULAR | Status: AC
  Filled 2020-07-17: qty 5

## 2020-07-17 MED ORDER — ROCURONIUM BROMIDE 10 MG/ML (PF) SYRINGE
PREFILLED_SYRINGE | INTRAVENOUS | Status: DC | PRN
  Administered 2020-07-17: 60 mg via INTRAVENOUS
  Administered 2020-07-17: 40 mg via INTRAVENOUS

## 2020-07-17 MED ORDER — DEXAMETHASONE SODIUM PHOSPHATE 10 MG/ML IJ SOLN
INTRAMUSCULAR | Status: AC
  Filled 2020-07-17: qty 1

## 2020-07-17 MED ORDER — ONDANSETRON HCL 4 MG/2ML IJ SOLN
INTRAMUSCULAR | Status: DC | PRN
  Administered 2020-07-17: 4 mg via INTRAVENOUS

## 2020-07-17 MED ORDER — PROPOFOL 10 MG/ML IV BOLUS
INTRAVENOUS | Status: DC | PRN
  Administered 2020-07-17: 110 mg via INTRAVENOUS

## 2020-07-17 MED ORDER — MIDAZOLAM HCL 2 MG/2ML IJ SOLN
1.0000 mg | Freq: Once | INTRAMUSCULAR | Status: AC
  Administered 2020-07-17: 1 mg via INTRAVENOUS

## 2020-07-17 MED ORDER — DEXAMETHASONE SODIUM PHOSPHATE 10 MG/ML IJ SOLN
INTRAMUSCULAR | Status: DC | PRN
  Administered 2020-07-17: 5 mg via INTRAVENOUS

## 2020-07-17 MED ORDER — PHENYLEPHRINE HCL-NACL 10-0.9 MG/250ML-% IV SOLN
INTRAVENOUS | Status: DC | PRN
  Administered 2020-07-17: 40 ug/min via INTRAVENOUS

## 2020-07-17 MED ORDER — ONDANSETRON HCL 4 MG/2ML IJ SOLN
INTRAMUSCULAR | Status: AC
  Filled 2020-07-17: qty 2

## 2020-07-17 MED ORDER — EPHEDRINE SULFATE-NACL 50-0.9 MG/10ML-% IV SOSY
PREFILLED_SYRINGE | INTRAVENOUS | Status: DC | PRN
  Administered 2020-07-17 (×2): 5 mg via INTRAVENOUS

## 2020-07-17 MED ORDER — LIDOCAINE 2% (20 MG/ML) 5 ML SYRINGE
INTRAMUSCULAR | Status: AC
  Filled 2020-07-17: qty 5

## 2020-07-17 MED ORDER — CHLORHEXIDINE GLUCONATE 0.12 % MT SOLN
15.0000 mL | OROMUCOSAL | Status: AC
  Filled 2020-07-17: qty 15

## 2020-07-17 MED ORDER — MIDAZOLAM HCL 5 MG/5ML IJ SOLN
INTRAMUSCULAR | Status: DC | PRN
  Administered 2020-07-17: 2 mg via INTRAVENOUS

## 2020-07-17 MED ORDER — FENTANYL CITRATE (PF) 100 MCG/2ML IJ SOLN
INTRAMUSCULAR | Status: AC
  Filled 2020-07-17: qty 2

## 2020-07-17 MED ORDER — ROCURONIUM BROMIDE 10 MG/ML (PF) SYRINGE
PREFILLED_SYRINGE | INTRAVENOUS | Status: AC
  Filled 2020-07-17: qty 10

## 2020-07-17 MED ORDER — PHENYLEPHRINE 40 MCG/ML (10ML) SYRINGE FOR IV PUSH (FOR BLOOD PRESSURE SUPPORT)
PREFILLED_SYRINGE | INTRAVENOUS | Status: DC | PRN
  Administered 2020-07-17 (×2): 80 ug via INTRAVENOUS

## 2020-07-17 MED ORDER — SODIUM CHLORIDE 0.9 % IR SOLN
Status: DC | PRN
  Administered 2020-07-17: 1000 mL via INTRAVESICAL

## 2020-07-17 MED ORDER — ONDANSETRON HCL 4 MG/2ML IJ SOLN: 4.0000 mg | Freq: Once | INTRAMUSCULAR | Status: DC | PRN

## 2020-07-17 SURGICAL SUPPLY — 50 items
ADAPTER GOLDBERG URETERAL (ADAPTER) ×3 IMPLANT
BLADE CLIPPER SURG (BLADE) IMPLANT
CANISTER SUCT 3000ML PPV (MISCELLANEOUS) ×3 IMPLANT
CATH INTERMIT  6FR 70CM (CATHETERS) ×3 IMPLANT
COVER SURGICAL LIGHT HANDLE (MISCELLANEOUS) ×3 IMPLANT
COVER WAND RF STERILE (DRAPES) ×3 IMPLANT
DRESSING VERAFLO CLEANSE CC (GAUZE/BANDAGES/DRESSINGS) ×2 IMPLANT
DRSG OPSITE POSTOP 4X10 (GAUZE/BANDAGES/DRESSINGS) IMPLANT
DRSG OPSITE POSTOP 4X8 (GAUZE/BANDAGES/DRESSINGS) IMPLANT
DRSG VAC ATS MED SENSATRAC (GAUZE/BANDAGES/DRESSINGS) ×3 IMPLANT
DRSG VERAFLO CLEANSE CC (GAUZE/BANDAGES/DRESSINGS) ×3
ELECT BLADE 4.0 EZ CLEAN MEGAD (MISCELLANEOUS) ×3
ELECT CAUTERY BLADE 6.4 (BLADE) ×3 IMPLANT
ELECT REM PT RETURN 9FT ADLT (ELECTROSURGICAL) ×3
ELECTRODE BLDE 4.0 EZ CLN MEGD (MISCELLANEOUS) ×2 IMPLANT
ELECTRODE REM PT RTRN 9FT ADLT (ELECTROSURGICAL) ×2 IMPLANT
GLOVE BIO SURGEON STRL SZ8 (GLOVE) ×6 IMPLANT
GLOVE SRG 8 PF TXTR STRL LF DI (GLOVE) ×4 IMPLANT
GLOVE SURG UNDER POLY LF SZ8 (GLOVE) ×2
GOWN STRL REUS W/ TWL LRG LVL3 (GOWN DISPOSABLE) ×8 IMPLANT
GOWN STRL REUS W/ TWL XL LVL3 (GOWN DISPOSABLE) ×4 IMPLANT
GOWN STRL REUS W/TWL LRG LVL3 (GOWN DISPOSABLE) ×4
GOWN STRL REUS W/TWL XL LVL3 (GOWN DISPOSABLE) ×2
GUIDEWIRE STR DUAL SENSOR (WIRE) ×3 IMPLANT
KIT SIGMOIDOSCOPE (SET/KITS/TRAYS/PACK) IMPLANT
KIT TURNOVER KIT B (KITS) ×3 IMPLANT
LIGASURE IMPACT 36 18CM CVD LR (INSTRUMENTS) ×3 IMPLANT
NS IRRIG 1000ML POUR BTL (IV SOLUTION) ×6 IMPLANT
PACK COLON (CUSTOM PROCEDURE TRAY) ×3 IMPLANT
PAD ARMBOARD 7.5X6 YLW CONV (MISCELLANEOUS) ×6 IMPLANT
PENCIL SMOKE EVACUATOR (MISCELLANEOUS) ×3 IMPLANT
RELOAD PROXIMATE 75MM BLUE (ENDOMECHANICALS) ×3 IMPLANT
SEALER TISSUE G2 CVD JAW 35 (ENDOMECHANICALS) ×2 IMPLANT
SEALER TISSUE G2 CVD JAW 45CM (ENDOMECHANICALS) ×1
SPECIMEN JAR X LARGE (MISCELLANEOUS) ×3 IMPLANT
SPONGE LAP 18X18 RF (DISPOSABLE) IMPLANT
STAPLER PROXIMATE 75MM BLUE (STAPLE) ×3 IMPLANT
STAPLER VISISTAT 35W (STAPLE) ×3 IMPLANT
SURGILUBE 2OZ TUBE FLIPTOP (MISCELLANEOUS) IMPLANT
SUT PDS AB 1 TP1 96 (SUTURE) ×6 IMPLANT
SUT PROLENE 2 0 CT2 30 (SUTURE) IMPLANT
SUT PROLENE 2 0 KS (SUTURE) IMPLANT
SUT SILK 2 0 SH CR/8 (SUTURE) ×3 IMPLANT
SUT SILK 2 0 TIES 10X30 (SUTURE) ×3 IMPLANT
SUT SILK 3 0 SH CR/8 (SUTURE) ×3 IMPLANT
SUT SILK 3 0 TIES 10X30 (SUTURE) ×3 IMPLANT
SUT VIC AB 3-0 SH 18 (SUTURE) IMPLANT
TRAY FOLEY MTR SLVR 14FR STAT (SET/KITS/TRAYS/PACK) ×3 IMPLANT
TUBE CONNECTING 12X1/4 (SUCTIONS) ×6 IMPLANT
UNDERPAD 30X36 HEAVY ABSORB (UNDERPADS AND DIAPERS) IMPLANT

## 2020-07-17 NOTE — Anesthesia Preprocedure Evaluation (Signed)
Anesthesia Evaluation  Patient identified by MRN, date of birth, ID band Patient awake    Reviewed: Allergy & Precautions, NPO status , Patient's Chart, lab work & pertinent test results  Airway Mallampati: III  TM Distance: >3 FB Neck ROM: Full    Dental  (+) Teeth Intact, Dental Advisory Given   Pulmonary Current Smoker,    breath sounds clear to auscultation       Cardiovascular  Rhythm:Regular Rate:Normal     Neuro/Psych    GI/Hepatic   Endo/Other    Renal/GU      Musculoskeletal   Abdominal (+) + obese,   Peds  Hematology   Anesthesia Other Findings   Reproductive/Obstetrics                             Anesthesia Physical Anesthesia Plan  ASA: III  Anesthesia Plan: General   Post-op Pain Management:    Induction: Intravenous  PONV Risk Score and Plan: Dexamethasone and Ondansetron  Airway Management Planned: Oral ETT  Additional Equipment:   Intra-op Plan:   Post-operative Plan: Extubation in OR  Informed Consent: I have reviewed the patients History and Physical, chart, labs and discussed the procedure including the risks, benefits and alternatives for the proposed anesthesia with the patient or authorized representative who has indicated his/her understanding and acceptance.     Dental advisory given  Plan Discussed with: CRNA  Anesthesia Plan Comments:         Anesthesia Quick Evaluation

## 2020-07-17 NOTE — Op Note (Signed)
   Operative Note   Date: 07/17/2020  Procedure: exploratory laparotomy, segmental colectomy (sigmoid colon), abdominal washout, JP drain placement, colostomy creation  Pre-op diagnosis: sigmoid diverticulitis, pelvic abscess Post-op diagnosis: same  Indication and clinical history: The patient is a 40 y.o. year old female with sigmoid diverticulitis refractory to non-operative management     Surgeon: Jesusita Oka, MD Assistant: Grandville Silos, MD  Anesthesiologist: Linna Caprice, MD Anesthesia: General  Findings:  . Specimen: sigmoid colon, aerobic/anaerobic cultures . EBL: 40cc . Drains/Implants: JP x1, terminates in pelvis, exits R abdomen; left ureteral stent, foley catheter    Disposition: PACU - hemodynamically stable.  Description of procedure: The patient was positioned supine on the operating room table. General anesthetic induction and intubation were uneventful. Foley catheter insertion was performed and was atraumatic. Time-out was performed verifying correct patient, procedure, signature of informed consent, and administration of pre-operative antibiotics.   Dr. Alinda Money began this procedure by cystoscopically placing a left ureteral stent and foley catheter. Please see separate operative not for details of this procedure.  The abdomen was prepped and draped in the usual sterile fashion. A midline incision was made and deepened through the fascia. The abdominal cavity was explored and the sigmoid colon was found to be densely adherent to the abdominal wall. This was dissected off mostly using blunt dissection and during dissection revealed multiple pockets of pus. Aerobic and anaerobic cultures of this purulent fluid were sent. The colon was resected to healthy tissue proximally and distally. The mesentery was transected using the Enseal device. The abdomen was copiously irrigated. A site for the ostomy was created in the left abdominal wall and the proximal colon end brought through  this site. A JP drain was placed in the right abdomen and terminated in the pelvis. The fascia was closed with #1 looped PDS suture and a wound vac placed in the subcutaneous tissues. THe ostomy was matured and an ostomy appliance placed over it.   All sponge and instrument counts were correct at the conclusion of the procedure. The patient was awakened from anesthesia, extubated uneventfully, and transported to the PACU in good condition. There were no complications.   Upon entering the abdomen (organ space), I encountered an abscess in the pelvis.  CASE DATA:  Type of patient?: DOW CASE (Surgical Hospitalist [facility])  Status of Case? URGENT Add On  Infection Present At Time Of Surgery (PATOS)?  ABSCESS in the pelvis, PURULENCE at the sigmoid colon   Jesusita Oka, MD General and [place] [facility]

## 2020-07-17 NOTE — Progress Notes (Signed)
General Surgery Follow Up Note  Subjective:    Overnight Issues:   Objective:  Vital signs for last 24 hours: Temp:  [97.9 F (36.6 C)-98.3 F (36.8 C)] 98 F (36.7 C) (05/05 0519) Pulse Rate:  [61-70] 65 (05/05 0519) Resp:  [18] 18 (05/05 0519) BP: (91-113)/(53-69) 91/53 (05/05 0526) SpO2:  [94 %-97 %] 97 % (05/05 0519) Weight:  [133.1 kg] 133.1 kg (05/05 0830)  Hemodynamic parameters for last 24 hours:    Intake/Output from previous day: 05/04 0701 - 05/05 0700 In: 1333.9 [P.O.:840; I.V.:317.8; IV Piggyback:176.2] Out: -   Intake/Output this shift: No intake/output data recorded.  Vent settings for last 24 hours:    Physical Exam:  Gen: comfortable, no distress Neuro: non-focal exam HEENT: PERRL Neck: supple CV: RRR Pulm: unlabored breathing Abd: soft, TTP GU: clear yellow urine Extr: wwp, no edema   Results for orders placed or performed during the hospital encounter of 07/10/20 (from the past 24 hour(s))  Urinalysis, Routine w reflex microscopic Urine, Clean Catch     Status: Abnormal   Collection Time: 07/16/20 12:39 PM  Result Value Ref Range   Color, Urine YELLOW YELLOW   APPearance CLEAR CLEAR   Specific Gravity, Urine 1.013 1.005 - 1.030   pH 9.0 (H) 5.0 - 8.0   Glucose, UA NEGATIVE NEGATIVE mg/dL   Hgb urine dipstick SMALL (A) NEGATIVE   Bilirubin Urine NEGATIVE NEGATIVE   Ketones, ur NEGATIVE NEGATIVE mg/dL   Protein, ur NEGATIVE NEGATIVE mg/dL   Nitrite NEGATIVE NEGATIVE   Leukocytes,Ua NEGATIVE NEGATIVE   RBC / HPF 0-5 0 - 5 RBC/hpf   WBC, UA 0-5 0 - 5 WBC/hpf   Bacteria, UA NONE SEEN NONE SEEN   Squamous Epithelial / LPF 0-5 0 - 5   Mucus PRESENT   Glucose, capillary     Status: Abnormal   Collection Time: 07/17/20 12:09 AM  Result Value Ref Range   Glucose-Capillary 120 (H) 70 - 99 mg/dL  Comprehensive metabolic panel     Status: Abnormal   Collection Time: 07/17/20  4:15 AM  Result Value Ref Range   Sodium 135 135 - 145 mmol/L    Potassium 4.0 3.5 - 5.1 mmol/L   Chloride 102 98 - 111 mmol/L   CO2 27 22 - 32 mmol/L   Glucose, Bld 102 (H) 70 - 99 mg/dL   BUN 9 6 - 20 mg/dL   Creatinine, Ser 0.97 0.44 - 1.00 mg/dL   Calcium 8.8 (L) 8.9 - 10.3 mg/dL   Total Protein 6.0 (L) 6.5 - 8.1 g/dL   Albumin 2.3 (L) 3.5 - 5.0 g/dL   AST 20 15 - 41 U/L   ALT 19 0 - 44 U/L   Alkaline Phosphatase 51 38 - 126 U/L   Total Bilirubin 0.2 (L) 0.3 - 1.2 mg/dL   GFR, Estimated >60 >60 mL/min   Anion gap 6 5 - 15  Prealbumin     Status: Abnormal   Collection Time: 07/17/20  4:15 AM  Result Value Ref Range   Prealbumin 7.2 (L) 18 - 38 mg/dL  Magnesium     Status: None   Collection Time: 07/17/20  4:15 AM  Result Value Ref Range   Magnesium 2.2 1.7 - 2.4 mg/dL  Phosphorus     Status: Abnormal   Collection Time: 07/17/20  4:15 AM  Result Value Ref Range   Phosphorus 5.2 (H) 2.5 - 4.6 mg/dL  Triglycerides     Status: Abnormal  Collection Time: 07/17/20  4:15 AM  Result Value Ref Range   Triglycerides 152 (H) <150 mg/dL  CBC     Status: Abnormal   Collection Time: 07/17/20  4:15 AM  Result Value Ref Range   WBC 11.3 (H) 4.0 - 10.5 K/uL   RBC 3.98 3.87 - 5.11 MIL/uL   Hemoglobin 11.6 (L) 12.0 - 15.0 g/dL   HCT 35.7 (L) 36.0 - 46.0 %   MCV 89.7 80.0 - 100.0 fL   MCH 29.1 26.0 - 34.0 pg   MCHC 32.5 30.0 - 36.0 g/dL   RDW 13.4 11.5 - 15.5 %   Platelets 446 (H) 150 - 400 K/uL   nRBC 0.0 0.0 - 0.2 %  Differential     Status: None   Collection Time: 07/17/20  4:15 AM  Result Value Ref Range   Neutrophils Relative % 67 %   Neutro Abs 7.6 1.7 - 7.7 K/uL   Lymphocytes Relative 25 %   Lymphs Abs 2.8 0.7 - 4.0 K/uL   Monocytes Relative 6 %   Monocytes Absolute 0.6 0.1 - 1.0 K/uL   Eosinophils Relative 2 %   Eosinophils Absolute 0.2 0.0 - 0.5 K/uL   Basophils Relative 0 %   Basophils Absolute 0.1 0.0 - 0.1 K/uL   Immature Granulocytes 0 %   Abs Immature Granulocytes 0.04 0.00 - 0.07 K/uL  Glucose, capillary     Status:  None   Collection Time: 07/17/20  6:00 AM  Result Value Ref Range   Glucose-Capillary 98 70 - 99 mg/dL  Type and screen [place]     Status: None (Preliminary result)   Collection Time: 07/17/20  7:43 AM  Result Value Ref Range   ABO/RH(D) PENDING    Antibody Screen PENDING    Sample Expiration      07/20/2020,2359 Performed at [facility], [place]. [address] [place], [place] [address]     Assessment & Plan:  Present on Admission: . Diverticulitis of intestine with abscess    LOS: 7 days   Additional comments:I reviewed the patient's new clinical lab test results.   and I reviewed the patients new imaging test results.    Sigmoid diverticulitis with perforation and 8.5x6.7x7.8 cm phlegmon  - with recent hospitalization for complicated diverticulitis at [facility] 3/30-06/17/20, treated with IV abx - CT abdomen pelvis significant for large phlegmon in the pelvis -  not  amenable to IR drainage - repeat CT 5/1 with stable diverticular abscess - discussed with IR (Kristin Sawyer) and no window for percutaneous drainage - failed nonop management, OR today for sigmoid colectomy and colostomy  The perioperative morbidity of performing a Hartmann's procedure on this patient is high given her poor nutrition, morbid obesity, smoking history, and active infection/abscess. She is at risk for post-operative abscess, wound infection, stump leak, colostomy retraction or necrosis,etc. She is also at risk for ureteral injury given the amount and duration of inflammation. I have spoken with Kristin Sawyer and he will place a L ureteral stent intra-op.  FEN: saline lock IV, NPO ID: Zosyn 4/28 >> d7 VTE: SCD's, Lovenox Foley: none  Kristin Oka, MD Trauma & General Surgery Please use [email or web address] to contact on call provider  07/17/2020  *Care during the described time interval was provided by me. I have reviewed this patient's available data, including medical history, events of  note, physical examination and test results as part of my evaluation.

## 2020-07-17 NOTE — Op Note (Signed)
Preoperative diagnosis:  1. Diverticulitis   Postoperative diagnosis:  1. Diverticulitis   Procedure:  1. Cystoscopy 2. Left ureteral stent placement (6 Fr open ended) 3. Left retrograde pyelography with interpretation  Surgeon: Pryor Curia. M.D.  Anesthesia: General  Complications: None  Intraoperative findings: Left retrograde pyelogram was performed with ominpaque contrast and a 6 Fr ureteral catheter.  This revealed a normal caliber ureter with mild mid ureteral deviation medially but without intrinsic filling defects.  No hydronephrosis or renal collecting system filling defects.  Specimens: None  Indication: Kristin Sawyer is a 40 y.o. patient with diverticulitis with a large left sided phlegmon requiring surgical treatment and partial colectomy. Left ureteral stent placement was requested by general surgery for intraoperative identification of the ureter. After reviewing the management options for treatment, she elected to proceed with the above surgical procedure(s). We have discussed the potential benefits and risks of the procedure, side effects of the proposed treatment, the likelihood of the patient achieving the goals of the procedure, and any potential problems that might occur during the procedure or recuperation. Informed consent has been obtained.  Description of procedure:  The patient was taken to the operating room and general anesthesia was induced.  The patient was placed in the dorsal lithotomy position, prepped and draped in the usual sterile fashion, and preoperative antibiotics were administered. A preoperative time-out was performed.   Cystourethroscopy was performed.  The patient's urethra was examined and was normal. The bladder was then systematically examined in its entirety. There was no evidence for any bladder tumors or stones.  There was significant posterior bladder edema without evidence of a clear fistula.  Attention then turned to the  left ureteral orifice and a ureteral catheter was used to intubate the ureteral orifice.  Omnipaque contrast was injected and a retrograde pyelogram was performed. Findings are as dictated above.  A 0.38 sensor guidewire was then advanced up the left ureter into the renal pelvis under fluoroscopic guidance.  The 6 Fr ureteral catheter was then advanced over the wire into the renal collecting system.  The stent was then secured to the Foley catheter and secured to the drainage back with a Lyda Kalata adapter.  The stent will be removed by Dr. Bobbye Morton at the end of the procedure.  The procedure was then turned over to Dr. Bobbye Morton for the partial colectomy.   Pryor Curia MD

## 2020-07-17 NOTE — Progress Notes (Signed)
PHARMACY - TOTAL PARENTERAL NUTRITION CONSULT NOTE   Indication: malnutrition in the setting of persistent diverticulitis x 4 weeks and inadequate PO intake  Patient Measurements: Height: 5\' 3"  (160 cm) Weight: 133.1 kg (293 lb 6.9 oz) IBW/kg (Calculated) : 52.4 TPN AdjBW (KG): 72.6 Body mass index is 51.98 kg/m.  Assessment: 55 yof presenting 4/28 with sigmoid diverticulitis with perforation and phlegmon with recent hospitalization treated with IV abx. Surgery does not believe patient will do well on IV abx alone. Patient reports increased intermittent, sharp suprapubic abdominal pain, emesis x 1 overnight, ongoing nonbloody BMs becoming more formed. Plan to start TPN and consider surgery this admission. Patient reportedly with malnutrition x 4 weeks with inadequate PO intake.  Glucose / Insulin: no hx DM. CBGs controlled. Utilized 0 units SSI in last 24hrs Electrolytes: all stable WNL except Phos high at 5.2 (Ca x Phos = 52) Renal: SCr 0.97 stable, BUN WNL Hepatic: LFTs / Tbili WNL. TG 152. Albumin 2.3. Prealbumin low 7.2 Intake / Output; MIVF: I/O's appear inaccurate. Emesis x 1 5/3 PM, ongoing nonbloody BMs - more formed per Surgery 5/4 GI Imaging: none since TPN start GI Surgeries / Procedures: 5/5 planned Hartmann procedure  Central access: PICC placed 5/4 TPN start date: 07/16/20  Nutritional Goals (per RD recommendation on 5/4): kCal: 2150-2350, Protein: 120-135g, Fluid: >/= 2L per day  Goal TPN rate is 90 mL/hr (provides 121 g of protein and 2160 kcal per day)  Current Nutrition:  TPN Ensure Enlive TID with meals - all doses charted yesterday Soft diet (50% of 1 meal charted today)  Plan:  Titrate TPN to goal rate 90 mL/hr at 1800. Will provide 100% of patient protein and kCal needs. Per discussion with Trauma 5/5, continuing TPN until bowel function returns. Electrolytes in TPN: continue same today except reduce Phos to 8 mmol/L; Na 42mEq/L, K 28mEq/L, Ca 64mEq/L, Mg  75mEq/L. Cl:Ac 1:1 Add standard MVI and trace elements to TPN Continue Moderate q6h SSI and adjust as needed  Monitor TPN labs on Mon/Thurs Surgery planning Hartmann procedure on 5/5 - f/u post-op plans   Arturo Morton, PharmD, BCPS Please check AMION for all [place] contact numbers Clinical Pharmacist 07/17/2020 8:12 AM

## 2020-07-17 NOTE — Progress Notes (Signed)
Patient ID: Kristin Sawyer, female   DOB: 04/06/1980, 40 y.o.   MRN: [id number]  Day of Surgery Subjective: Pt with diverticulits requiring partial colectomy today.  Urology requested for cystoscopy and ureteral stent placement to aid with intraoperative identification of left ureter.  Objective: Vital signs in last 24 hours: Temp:  [97.9 F (36.6 C)-98.3 F (36.8 C)] 98 F (36.7 C) (05/05 0519) Pulse Rate:  [61-70] 65 (05/05 0519) Resp:  [18] 18 (05/05 0519) BP: (91-113)/(53-69) 91/53 (05/05 0526) SpO2:  [94 %-97 %] 97 % (05/05 0519) Weight:  [133.1 kg] 133.1 kg (05/05 0830)  Intake/Output from previous day: 05/04 0701 - 05/05 0700 In: 1333.9 [P.O.:840; I.V.:317.8; IV Piggyback:176.2] Out: -  Intake/Output this shift: No intake/output data recorded.  Physical Exam:  General: Alert and oriented   Lab Results: Recent Labs    07/16/20 0133 07/17/20 0415  HGB 12.0 11.6*  HCT 37.1 35.7*   BMET Recent Labs    07/17/20 0415  NA 135  K 4.0  CL 102  CO2 27  GLUCOSE 102*  BUN 9  CREATININE 0.97  CALCIUM 8.8*     Studies/Results:   Assessment/Plan: 1) Diverticulitis: Plan to proceed with cystoscopy and left ureteral stent placement for intraoperative identification of left ureter.  I discussed the potential benefits and risks of the procedure, side effects of the proposed treatment, the likelihood of the patient achieving the goals of the procedure, and any potential problems that might occur during the procedure or recuperation. She gives informed consent to proceed.    LOS: 7 days   Dutch Gray 07/17/2020, 9:14 AM

## 2020-07-17 NOTE — Progress Notes (Signed)
Report given to [facility] in pre-op.

## 2020-07-17 NOTE — Anesthesia Postprocedure Evaluation (Signed)
Anesthesia Post Note  Patient: Armed forces technical officer  Procedure(s) Performed: PARTIAL COLECTOMY (N/A Abdomen) CYSTOSCOPY WITH STENT PLACEMENT (Left Ureter)     Patient location during evaluation: PACU Anesthesia Type: General Level of consciousness: awake and alert Pain management: pain level controlled Vital Signs Assessment: post-procedure vital signs reviewed and stable Respiratory status: spontaneous breathing, nonlabored ventilation, respiratory function stable and patient connected to nasal cannula oxygen Cardiovascular status: blood pressure returned to baseline and stable Postop Assessment: no apparent nausea or vomiting Anesthetic complications: no   No complications documented.  Last Vitals:  Vitals:   07/17/20 1359 07/17/20 1502  BP: 110/70 122/75  Pulse: 91 87  Resp: 20 16  Temp: 36.8 C (!) 36.4 C  SpO2: 94% 97%    Last Pain:  Vitals:   07/17/20 1429  TempSrc:   PainSc: 10-Worst pain ever                 Lemonte Al COKER

## 2020-07-17 NOTE — Transfer of Care (Signed)
Immediate Anesthesia Transfer of Care Note  Patient: Kristin Sawyer  Procedure(s) Performed: PARTIAL COLECTOMY (N/A Abdomen) CYSTOSCOPY WITH STENT PLACEMENT (Left Ureter)  Patient Location: PACU  Anesthesia Type:General  Level of Consciousness: awake, alert  and oriented  Airway & Oxygen Therapy: Patient Spontanous Breathing and Patient connected to face mask oxygen  Post-op Assessment: Report given to RN, Post -op Vital signs reviewed and stable and Patient moving all extremities X 4  Post vital signs: Reviewed and stable  Last Vitals:  Vitals Value Taken Time  BP 125/77 07/17/20 1220  Temp    Pulse 91 07/17/20 1222  Resp 16 07/17/20 1222  SpO2 97 % 07/17/20 1222  Vitals shown include unvalidated device data.  Last Pain:  Vitals:   07/17/20 0519  TempSrc: Oral  PainSc:       Patients Stated Pain Goal: 0 (54/56/25 6389)  Complications: No complications documented.

## 2020-07-17 NOTE — Consult Note (Addendum)
[place] Nurse requested for preoperative stoma site marking. OR staff present and ready to take patient down.  Discussed surgical procedure and stoma creation with patient.  Explained role of the [place] nurse team.  Educational booklets and follow-up will be provided following surgery.   Examined patient lying, and sitting in order to place the marking in the patient's visual field, away from any creases or abdominal contour issues and within the rectus muscle.  Attempted to mark below the patient's belt line.   Marked for colostomy in the LLQ  4 cm to the left of the umbilicus and 5 cm below the umbilicus. Marked 5 cm to the left of the umbilicus and 5 cm above the umbilicus  Patient's abdomen cleansed with CHG wipes at site markings, allowed to air dry prior to marking.Covered mark with thin film transparent dressing to preserve mark until date of surgery.   [place] Nurse team will follow up with patient after surgery for continue ostomy care and teaching.   Kristin Marseilles Tamala Julian, MSN, RN, [place], [place], [facility] Wound Treatment Associate Pager [phone]

## 2020-07-17 NOTE — Anesthesia Procedure Notes (Signed)
Procedure Name: Intubation Date/Time: 07/17/2020 9:48 AM Performed by: Gaylene Brooks, CRNA Pre-anesthesia Checklist: Patient identified, Emergency Drugs available, Suction available and Patient being monitored Patient Re-evaluated:Patient Re-evaluated prior to induction Oxygen Delivery Method: Circle System Utilized Preoxygenation: Pre-oxygenation with 100% oxygen Induction Type: IV induction Ventilation: Mask ventilation without difficulty Laryngoscope Size: Miller and 2 Grade View: Grade I Tube type: Oral Tube size: 7.0 mm Number of attempts: 1 Airway Equipment and Method: Stylet and Oral airway Placement Confirmation: ETT inserted through vocal cords under direct vision,  positive ETCO2 and breath sounds checked- equal and bilateral Secured at: 22 cm Tube secured with: Tape Dental Injury: Teeth and Oropharynx as per pre-operative assessment

## 2020-07-17 NOTE — Progress Notes (Signed)
Report given to Judeth Porch, RN 6N. All belong sent with patient and mother.   Patient transferred to [phone]

## 2020-07-18 ENCOUNTER — Encounter (HOSPITAL_COMMUNITY): Payer: Self-pay | Admitting: Surgery

## 2020-07-18 LAB — COMPREHENSIVE METABOLIC PANEL
ALT: 27 U/L (ref 0–44)
AST: 30 U/L (ref 15–41)
Albumin: 2.5 g/dL — ABNORMAL LOW (ref 3.5–5.0)
Alkaline Phosphatase: 48 U/L (ref 38–126)
Anion gap: 6 (ref 5–15)
BUN: 11 mg/dL (ref 6–20)
CO2: 24 mmol/L (ref 22–32)
Calcium: 8.8 mg/dL — ABNORMAL LOW (ref 8.9–10.3)
Chloride: 103 mmol/L (ref 98–111)
Creatinine, Ser: 0.77 mg/dL (ref 0.44–1.00)
GFR, Estimated: >60 mL/min (ref >60)
Glucose, Bld: 122 mg/dL — ABNORMAL HIGH (ref 70–99)
Potassium: 4.5 mmol/L (ref 3.5–5.1)
Sodium: 133 mmol/L — ABNORMAL LOW (ref 135–145)
Total Bilirubin: 0.2 mg/dL — ABNORMAL LOW (ref 0.3–1.2)
Total Protein: 6.1 g/dL — ABNORMAL LOW (ref 6.5–8.1)

## 2020-07-18 LAB — GLUCOSE, CAPILLARY
Glucose-Capillary: 131 mg/dL — ABNORMAL HIGH (ref 70–99)
Glucose-Capillary: 112 mg/dL — ABNORMAL HIGH (ref 70–99)
Glucose-Capillary: 126 mg/dL — ABNORMAL HIGH (ref 70–99)

## 2020-07-18 LAB — MAGNESIUM: Magnesium: 1.9 mg/dL (ref 1.7–2.4)

## 2020-07-18 LAB — PHOSPHORUS: Phosphorus: 4 mg/dL (ref 2.5–4.6)

## 2020-07-18 MED ORDER — MENTHOL 3 MG MT LOZG
1.0000 | LOZENGE | OROMUCOSAL | Status: DC | PRN
  Filled 2020-07-18: qty 9

## 2020-07-18 MED ORDER — TRAVASOL 10 % IV SOLN
INTRAVENOUS | Status: AC
  Filled 2020-07-18: qty 1209.6

## 2020-07-18 MED ORDER — METHOCARBAMOL 1000 MG/10ML IJ SOLN
1000.0000 mg | Freq: Four times a day (QID) | INTRAVENOUS | Status: DC
  Administered 2020-07-18 – 2020-07-19 (×4): 1000 mg via INTRAVENOUS
  Filled 2020-07-18 (×7): qty 10

## 2020-07-18 MED ORDER — OXYCODONE HCL 5 MG PO TABS
10.0000 mg | ORAL_TABLET | ORAL | Status: DC | PRN
  Administered 2020-07-18 – 2020-07-19 (×4): 15 mg via ORAL
  Filled 2020-07-18: qty 3
  Filled 2020-07-18: qty 2
  Filled 2020-07-18 (×4): qty 3

## 2020-07-18 MED ORDER — BOOST / RESOURCE BREEZE PO LIQD CUSTOM
1.0000 | Freq: Three times a day (TID) | ORAL | Status: DC
  Administered 2020-07-18 – 2020-07-22 (×5): 1 via ORAL

## 2020-07-18 MED ORDER — LIDOCAINE 5 % EX PTCH
1.0000 | MEDICATED_PATCH | CUTANEOUS | Status: DC
  Administered 2020-07-18 – 2020-07-25 (×8): 1 via TRANSDERMAL
  Filled 2020-07-18 (×9): qty 1

## 2020-07-18 MED ORDER — ACETAMINOPHEN 500 MG PO TABS
1000.0000 mg | ORAL_TABLET | Freq: Four times a day (QID) | ORAL | Status: DC
  Administered 2020-07-20 – 2020-07-25 (×13): 1000 mg via ORAL
  Filled 2020-07-18 (×22): qty 2

## 2020-07-18 NOTE — Consult Note (Addendum)
[place] Nurse ostomy follow up Patient receiving care in [place]. Stoma type/location: LUQ colostomy Stomal assessment/size: ova, approximately 1 1/4 inches, recessed, sutures intact, red, moist; deep creases at 3 and 9. Peristomal assessment: intact Treatment options for stomal/peristomal skin: 1.5 barrier rings and flexible convex pouch Output drops of serosanginous had undermined the existing flat pouching system Ostomy pouching: 1pc. Ronna Polio #[id number], barrier ring Kellie Simmering [phone] Education provided: none Enrolled patient in [facility] Discharge program: No  Additional barrier rings ordered, two ostomy belts, 2 VAC dressings, and one cannister. To be placed in patient's room. Val Riles, RN, MSN, CWOCN, CNS-BC, pager [phone]

## 2020-07-18 NOTE — Evaluation (Signed)
Physical Therapy Evaluation Patient Details Name: Kristin Sawyer MRN: [id number] DOB: June 25, 1980 Today's Date: 07/18/2020   History of Present Illness  Pt admit 4/28 with Sigmoid diverticulitis with perforation and 8.5x6.7x7.8 cm phlegmon.and VAc in place on abdomen.  5/5 for sigmoid colectomy and colostomy.PMH: obesity, smoker  Clinical Impression  Pt admitted with above diagnosis. Pt was able to pivot to chair with +2 min assist with mother present during session.  Sjhould have assist at home and should progress well.   Pt currently with functional limitations due to the deficits listed below (see PT Problem List). Pt will benefit from skilled PT to increase their independence and safety with mobility to allow discharge to the venue listed below.      Follow Up Recommendations Home health PT    Equipment Recommendations  Rolling walker with 5" wheels;3in1 (PT)    Recommendations for Other Services       Precautions / Restrictions Precautions Precautions: Fall Precaution Comments: JP drain, VAC, colostomy Restrictions Weight Bearing Restrictions: No      Mobility  Bed Mobility Overal bed mobility: Needs Assistance Bed Mobility: Rolling;Sidelying to Sit Rolling: Mod assist;Max assist;+2 for physical assistance Sidelying to sit: Mod assist;Max assist;+2 for physical assistance;HOB elevated       General bed mobility comments: Pt needed max cues for technique. Educated pt that she could hug pillow to ease pain inabdoment and pt was trying to do so but had difficulty holding it with one hand and rolling to side due to body habitus.  mother assisted PT with assisting pt toEOB with pt needing assist with LEs, trunk elevation and use of pad to perform helicopter technique.  Took incr time to get pt to scoot to eob where feet were on floor.    Transfers Overall transfer level: Needs assistance Equipment used: Rolling walker (2 wheeled) Transfers: Sit to/from [employer] Sit to Stand: Min assist;+2 safety/equipment;From elevated surface Stand pivot transfers: Min guard;Min assist;+2 safety/equipment       General transfer comment: Pt needed min assist to power up but once up able to stand pivot to recliner without physical assist and pt was able to reach back and sit to recliner without cues or assist.  Ambulation/Gait             General Gait Details: TBA  Stairs            Wheelchair Mobility    Modified Rankin (Stroke Patients Only)       Balance Overall balance assessment: Needs assistance Sitting-balance support: No upper extremity supported;Feet supported Sitting balance-Leahy Scale: Fair Sitting balance - Comments: Pt could sit EOB without assist   Standing balance support: Bilateral upper extremity supported;During functional activity Standing balance-Leahy Scale: Poor Standing balance comment: relies on UE support and RW                             Pertinent Vitals/Pain Pain Assessment: Faces Faces Pain Scale: Hurts worst Pain Location: abdomen Pain Descriptors / Indicators: Grimacing;Sharp Pain Intervention(s): Limited activity within patient's tolerance;Monitored during session;Repositioned;Premedicated before session    Home Living Family/patient expects to be discharged to:: Private residence Living Arrangements: Children (4 year old son) Available Help at Discharge: Family;Available 24 hours/day Type of Home: House Home Access: Stairs to enter Entrance Stairs-Rails: None Entrance Stairs-Number of Steps: 3 Home Layout: One level Home Equipment: None      Prior Function Level of Independence: Independent  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     Hand Dominance   Dominant Hand: Right    Extremity/Trunk Assessment   Upper Extremity Assessment Upper Extremity Assessment: Defer to OT evaluation    Lower Extremity Assessment Lower Extremity Assessment: Generalized  weakness    Cervical / Trunk Assessment Cervical / Trunk Assessment: Normal  Communication   Communication: No difficulties  Cognition Arousal/Alertness: Awake/alert Behavior During Therapy: Flat affect Overall Cognitive Status: Within Functional Limits for tasks assessed                                        General Comments      Exercises     Assessment/Plan    PT Assessment Patient needs continued PT services  PT Problem List Decreased activity tolerance;Decreased balance;Decreased mobility;Decreased knowledge of use of DME;Decreased safety awareness;Decreased knowledge of precautions;Obesity;Pain       PT Treatment Interventions DME instruction;Gait training;Stair training;Functional mobility training;Therapeutic activities;Therapeutic exercise;Balance training;Patient/family education    PT Goals (Current goals can be found in the Care Plan section)  Acute Rehab PT Goals Patient Stated Goal: to go home PT Goal Formulation: With patient Time For Goal Achievement: 08/01/20 Potential to Achieve Goals: Good    Frequency Min 3X/week   Barriers to discharge        Co-evaluation               AM-PAC PT "6 Clicks" Mobility  Outcome Measure Help needed turning from your back to your side while in a flat bed without using bedrails?: Total Help needed moving from lying on your back to sitting on the side of a flat bed without using bedrails?: Total Help needed moving to and from a bed to a chair (including a wheelchair)?: A Lot Help needed standing up from a chair using your arms (e.g., wheelchair or bedside chair)?: A Little Help needed to walk in hospital room?: A Lot Help needed climbing 3-5 steps with a railing? : A Lot 6 Click Score: 11    End of Session Equipment Utilized During Treatment: Gait belt Activity Tolerance: Patient limited by fatigue;Patient limited by pain Patient left: in chair;with call bell/phone within reach;with chair  alarm set;with family/visitor present Nurse Communication: Mobility status PT Visit Diagnosis: Muscle weakness (generalized) (M62.81)    Time: 0623-7628 PT Time Calculation (min) (ACUTE ONLY): 28 min   Charges:   PT Evaluation $PT Eval Moderate Complexity: 1 Mod PT Treatments $Therapeutic Activity: 8-22 mins       Alvira Philips 07/18/2020, 3:03 PM  Gary Gabrielsen M,PT Acute Rehab Services [phone] (pager)

## 2020-07-18 NOTE — Progress Notes (Addendum)
PHARMACY - TOTAL PARENTERAL NUTRITION CONSULT NOTE  Indication: malnutrition in the setting of persistent diverticulitis x 4 weeks and inadequate PO intake  Patient Measurements: Height: 5\' 3"  (160 cm) Weight: 133.1 kg (293 lb 6.9 oz) IBW/kg (Calculated) : 52.4 TPN AdjBW (KG): 72.6 Body mass index is 51.98 kg/m.  Assessment:  60 yof presenting 4/28 with sigmoid diverticulitis with perforation and phlegmon with recent hospitalization treated with IV abx. Surgery does not believe patient will do well on IV abx alone. Patient reports increased intermittent, sharp suprapubic abdominal pain, emesis x 1 overnight, ongoing nonbloody BMs becoming more formed. Plan to start TPN and consider surgery this admission. Patient reportedly with malnutrition x 4 weeks with inadequate PO intake.  Glucose / Insulin: no hx DM - CBGs acceptable.  Utilized 5 units SSI in last 24hrs Electrolytes: low Na, Phos down to 4, others WNL (K trending up) Renal: SCr < 1, BUN WNL Hepatic: LFTs / Tbili WNL. TG 152. Albumin 2.3. Prealbumin low 7.2 Intake / Output; MIVF: UOP 0.7 ml/kg/hr, drain 34mL. Emesis x 1 5/3 PM, ongoing nonbloody BMs - more formed per Surgery 5/4 GI Imaging: none since TPN start GI Surgeries / Procedures:  5/5 partial colectomy and cystoscopy with left ureteral stent placement  Central access: PICC placed 07/16/20 TPN start date: 07/16/20  Nutritional Goals (per RD rec on 5/4): kCal: 2150-2350, Protein: 120-135g, Fluid: >/= 2L per day  Current Nutrition:  TPN CLD - 0% meals charted   Plan:  Continue TPN at goal rate 90 mL/hr TPN provides 121g AA and 2160 kCal, meeting 100% of needs Electrolytes in TPN: increase Na 59mEq/L, reduce K lightly to 16mEq/L, Ca 50mEq/L, increase Mg slightly to 17mEq/L, reduce Phos 13mmol/L on 5/5. Cl:Ac 1:1 Add standard MVI and trace elements to TPN Continue moderate SSI Q6H post-op Repeat labs on Sun, monitor PO intake to advance diet and wean TPN F/U Zosyn  LOT  Trayquan Kolakowski D. Mina Marble, PharmD, BCPS, [place] 07/18/2020, 8:34 AM

## 2020-07-18 NOTE — Progress Notes (Signed)
Patient was able to get to the chair with PT today. She sat for approximately 2 hours before having to use the restroom and wanted to return to bed. Patient would rather use the bed pan vs the bedside commode despite staff telling her that it is better for her to get up to use the bathroom to prevent DVT, post-op ileus, and deconditioning. Patient tolerated her liquids today but became nauseas after having a boost breeze. Antiemetic given.

## 2020-07-18 NOTE — Progress Notes (Addendum)
Nutrition Follow-up  DOCUMENTATION CODES:   Morbid obesity  INTERVENTION:    TPN dosing per Pharmacy.   Add Boost Breeze po TID, each supplement provides 250 kcal and 9 grams of protein.   NUTRITION DIAGNOSIS:   Increased nutrient needs related to acute illness (diverticular abscess) as evidenced by estimated needs.  Ongoing  GOAL:   Patient will meet greater than or equal to 90% of their needs  MONITOR:   PO intake,Supplement acceptance  REASON FOR ASSESSMENT:   Consult New TPN/TNA  ASSESSMENT:   40 yo female admitted with sigmoid diverticulitis with perforation and phlegmon. PMH includes obesity, tobacco abuse, diverticulitis, vitamin B-12 deficiency.   PTA, patient reports eating poorly for several weeks d/t abdominal pain. She was eating soups, applesauce, and yogurt. She reports usual weight is ~280-290 lbs. Admission weight 293 lbs.  5/4 - TPN initiated. 5/5 - S/P sigmoid colectomy and colostomy.  Closed abdominal drain 342 ml output x 24 hours. No stool via colostomy yet.  Diet advanced to clear liquids today. Patient has not had a meal yet. She is not hungry, but willing to try some liquids. She had a Boost Breeze supplement on the day prior to surgery and tolerated it well. Will resume PO supplement to maximize oral protein intake.   Currently receiving TPN at 90 ml/h to provide 2160 kcal, 121 gm protein per day to meet 100% of estimated needs.   Labs reviewed. Na 133 CBG: 189-145-131  Prealbumin on 5/5 down to 7.2 (likely low d/t active infection/abscess). Low albumin or prealbumin is no longer used to diagnose malnutrition. These lab values are a poor indicator of nutrition status as they are reflective of inflammatory process, acute stress response, and levels are highly affected (decreased) by volume overload. [facility] uses the new malnutrition guidelines published by the American Society for Parenteral and Enteral Nutrition (A.S.P.E.N.) and the  Academy of Nutrition and Dietetics (AND).   Medications reviewed and include novolog.   Nutrition Focused Physical Exam:  Flowsheet Row Most Recent Value  Orbital Region No depletion  Upper Arm Region No depletion  Thoracic and Lumbar Region No depletion  Buccal Region No depletion  Temple Region No depletion  Clavicle Bone Region No depletion  Clavicle and Acromion Bone Region No depletion  Scapular Bone Region No depletion  Dorsal Hand No depletion  Patellar Region No depletion  Anterior Thigh Region No depletion  Posterior Calf Region No depletion  Edema (RD Assessment) None  Hair Reviewed  Eyes Reviewed  Mouth Reviewed  Skin Reviewed  Nails Reviewed       Diet Order:   Diet Order            Diet clear liquid Room service appropriate? Yes; Fluid consistency: Thin  Diet effective now                 EDUCATION NEEDS:   Not appropriate for education at this time  Skin:  Skin Assessment: Reviewed RN Assessment  Last BM:  5/2  Height:   Ht Readings from Last 1 Encounters:  07/17/20 5\' 3"  (1.6 m)    Weight:   Wt Readings from Last 1 Encounters:  07/17/20 133.1 kg    Ideal Body Weight:  52.3 kg  BMI:  Body mass index is 51.98 kg/m.  Estimated Nutritional Needs:   Kcal:  2150-2350  Protein:  120-135 gm  Fluid:  >/= 2 L    Lucas Mallow, RD, LDN, CNSC Please refer to Amion for contact  information.

## 2020-07-18 NOTE — Plan of Care (Signed)
  Problem: Skin Integrity: Goal: Risk for impaired skin integrity will decrease Outcome: Progressing   

## 2020-07-18 NOTE — Progress Notes (Signed)
General Surgery Follow Up Note  Subjective:    Overnight Issues:   Objective:  Vital signs for last 24 hours: Temp:  [97.5 F (36.4 C)-98.6 F (37 C)] 98.3 F (36.8 C) (05/06 0314) Pulse Rate:  [71-98] 71 (05/06 0314) Resp:  [13-25] 18 (05/06 0314) BP: (100-127)/(52-83) 111/52 (05/06 0314) SpO2:  [92 %-99 %] 94 % (05/06 0314) Weight:  [133.1 kg] 133.1 kg (05/05 0830)  Hemodynamic parameters for last 24 hours:    Intake/Output from previous day: 05/05 0701 - 05/06 0700 In: 2160 [I.V.:1810; IV Piggyback:350] Out: 2627 [Urine:2135; Drains:342; Blood:150]  Intake/Output this shift: No intake/output data recorded.  Vent settings for last 24 hours:    Physical Exam:  Gen: comfortable, no distress Neuro: non-focal exam HEENT: PERRL Neck: supple CV: RRR Pulm: unlabored breathing Abd: soft, appropriately TTP, midline vac with good seal, JP with 342 o/p, serous GU: pink tinged urine, foley with ureteral stent Extr: wwp, no edema   Results for orders placed or performed during the hospital encounter of 07/10/20 (from the past 24 hour(s))  Type and screen [place]     Status: None   Collection Time: 07/17/20  7:43 AM  Result Value Ref Range   ABO/RH(D) A POS    Antibody Screen NEG    Sample Expiration      07/20/2020,2359 Performed at [facility], [address] [place], [place] [address]   Glucose, capillary     Status: Abnormal   Collection Time: 07/17/20  8:54 AM  Result Value Ref Range   Glucose-Capillary 106 (H) 70 - 99 mg/dL  Aerobic/Anaerobic Culture w Gram Stain (surgical/deep wound)     Status: None (Preliminary result)   Collection Time: 07/17/20 10:44 AM   Specimen: PATH Cytology Misc. fluid; Body Fluid  Result Value Ref Range   Specimen Description WOUND PERFORATED DIVERTICULITIS    Special Requests PERFORATED DIVERTICULITIS    Gram Stain      ABUNDANT WBC PRESENT, PREDOMINANTLY PMN NO ORGANISMS SEEN Performed at [facility], [address] [place], [place] [address]    Culture PENDING    Report Status PENDING   Glucose, capillary     Status: Abnormal   Collection Time: 07/17/20  6:08 PM  Result Value Ref Range   Glucose-Capillary 189 (H) 70 - 99 mg/dL  Glucose, capillary     Status: Abnormal   Collection Time: 07/17/20 11:22 PM  Result Value Ref Range   Glucose-Capillary 145 (H) 70 - 99 mg/dL  Comprehensive metabolic panel     Status: Abnormal   Collection Time: 07/18/20  3:36 AM  Result Value Ref Range   Sodium 133 (L) 135 - 145 mmol/L   Potassium 4.5 3.5 - 5.1 mmol/L   Chloride 103 98 - 111 mmol/L   CO2 24 22 - 32 mmol/L   Glucose, Bld 122 (H) 70 - 99 mg/dL   BUN 11 6 - 20 mg/dL   Creatinine, Ser 0.77 0.44 - 1.00 mg/dL   Calcium 8.8 (L) 8.9 - 10.3 mg/dL   Total Protein 6.1 (L) 6.5 - 8.1 g/dL   Albumin 2.5 (L) 3.5 - 5.0 g/dL   AST 30 15 - 41 U/L   ALT 27 0 - 44 U/L   Alkaline Phosphatase 48 38 - 126 U/L   Total Bilirubin 0.2 (L) 0.3 - 1.2 mg/dL   GFR, Estimated >60 >60 mL/min   Anion gap 6 5 - 15  Magnesium  Status: None   Collection Time: 07/18/20  3:36 AM  Result Value Ref Range   Magnesium 1.9 1.7 - 2.4 mg/dL  Phosphorus     Status: None   Collection Time: 07/18/20  3:36 AM  Result Value Ref Range   Phosphorus 4.0 2.5 - 4.6 mg/dL  Glucose, capillary     Status: Abnormal   Collection Time: 07/18/20  5:59 AM  Result Value Ref Range   Glucose-Capillary 131 (H) 70 - 99 mg/dL    Assessment & Plan:  Present on Admission: . Diverticulitis of intestine with abscess    LOS: 8 days   Additional comments:I reviewed the patient's new clinical lab test results.   and I reviewed the patients new imaging test results.    Sigmoid diverticulitis with perforation and 8.5x6.7x7.8 cm phlegmon  - with recent hospitalization for complicated diverticulitis at [facility] 3/30-06/17/20, treated with IV abx - CT abdomen pelvis significant for large phlegmon in the pelvis - not amenable to  IR drainage - repeat CT 5/1 with stable diverticular abscess - discussed with IR (Dr. Serafina Royals) and no window for percutaneous drainage - failed nonop management, OR 5/5 for sigmoid colectomy and colostomy - NG out yest, okay for clears, but nothing more until flatus - foley and ureteral stent removed this AM, await spont void - vac changes starting 5/9  FEN: saline lock IV, NPO ID: Zosyn 4/28 >> d8, continue until 5/9 VTE: SCD's, Lovenox Foley: none   Jesusita Oka, MD Trauma & General Surgery Please use [email or web address] to contact on call provider  07/18/2020  *Care during the described time interval was provided by me. I have reviewed this patient's available data, including medical history, events of note, physical examination and test results as part of my evaluation.

## 2020-07-19 LAB — GLUCOSE, CAPILLARY
Glucose-Capillary: 116 mg/dL — ABNORMAL HIGH (ref 70–99)
Glucose-Capillary: 123 mg/dL — ABNORMAL HIGH (ref 70–99)
Glucose-Capillary: 133 mg/dL — ABNORMAL HIGH (ref 70–99)
Glucose-Capillary: 134 mg/dL — ABNORMAL HIGH (ref 70–99)
Glucose-Capillary: 138 mg/dL — ABNORMAL HIGH (ref 70–99)

## 2020-07-19 MED ORDER — METHOCARBAMOL 1000 MG/10ML IJ SOLN: 1000.0000 mg | Freq: Four times a day (QID) | INTRAVENOUS | Status: DC | PRN

## 2020-07-19 MED ORDER — METHOCARBAMOL 1000 MG/10ML IJ SOLN
1000.0000 mg | Freq: Four times a day (QID) | INTRAVENOUS | Status: DC
  Administered 2020-07-19 – 2020-07-24 (×19): 1000 mg via INTRAVENOUS
  Filled 2020-07-19: qty 10
  Filled 2020-07-19: qty 1000
  Filled 2020-07-19 (×2): qty 10
  Filled 2020-07-19: qty 1000
  Filled 2020-07-19 (×5): qty 10
  Filled 2020-07-19 (×2): qty 1000
  Filled 2020-07-19 (×6): qty 10
  Filled 2020-07-19: qty 1000
  Filled 2020-07-19: qty 10
  Filled 2020-07-19: qty 1000
  Filled 2020-07-19: qty 10

## 2020-07-19 MED ORDER — PANTOPRAZOLE SODIUM 40 MG IV SOLR
40.0000 mg | Freq: Two times a day (BID) | INTRAVENOUS | Status: DC
  Administered 2020-07-19 – 2020-07-22 (×7): 40 mg via INTRAVENOUS
  Filled 2020-07-19 (×7): qty 40

## 2020-07-19 MED ORDER — KETOROLAC TROMETHAMINE 15 MG/ML IJ SOLN
30.0000 mg | Freq: Four times a day (QID) | INTRAMUSCULAR | Status: AC
  Administered 2020-07-19 – 2020-07-24 (×20): 30 mg via INTRAVENOUS
  Filled 2020-07-19 (×20): qty 2

## 2020-07-19 MED ORDER — TRAVASOL 10 % IV SOLN
INTRAVENOUS | Status: AC
  Filled 2020-07-19: qty 1209.6

## 2020-07-19 MED ORDER — HYDROMORPHONE HCL 1 MG/ML IJ SOLN
0.5000 mg | INTRAMUSCULAR | Status: DC | PRN
  Administered 2020-07-19 – 2020-07-21 (×9): 0.5 mg via INTRAVENOUS
  Filled 2020-07-19 (×9): qty 1

## 2020-07-19 NOTE — Progress Notes (Addendum)
PT Cancellation Note  Patient Details Name: Kristin Sawyer MRN: [id number] DOB: 08/26/1980   Cancelled Treatment:    Reason Eval/Treat Not Completed: (P) Patient declined, no reason specified (pt reports too fatigued at this time of day, would prefer earlier session.) Will continue efforts per PT POC as schedule permits.   [place] 07/19/2020, 5:25 PM

## 2020-07-19 NOTE — Progress Notes (Signed)
PHARMACY - TOTAL PARENTERAL NUTRITION CONSULT NOTE  Indication: malnutrition in the setting of persistent diverticulitis x 4 weeks and inadequate PO intake  Patient Measurements: Height: 5\' 3"  (160 cm) Weight: 133.1 kg (293 lb 6.9 oz) IBW/kg (Calculated) : 52.4 TPN AdjBW (KG): 72.6 Body mass index is 51.98 kg/m.  Assessment:  65 yof presenting 4/28 with sigmoid diverticulitis with perforation and phlegmon with recent hospitalization treated with IV abx. Surgery does not believe patient will do well on IV abx alone. Patient reports increased intermittent, sharp suprapubic abdominal pain, emesis x 1 overnight, ongoing nonbloody BMs becoming more formed. Plan to start TPN and consider surgery this admission. Patient reportedly with malnutrition x 4 weeks with inadequate PO intake.  Glucose / Insulin: no hx DM - CBGs controlled. Utilized 6 units SSI in last 24hrs Electrolytes: low Na, Phos down to 4, others WNL (K trending up) Renal: SCr < 1, BUN WNL Hepatic: LFTs / Tbili WNL. TG 152. Albumin 2.5. Prealbumin low 7.2 Intake / Output; MIVF: UOP 0.1 ml/kg/hr per documentation, drain output 66mL/24hrs, no gas/stool noted GI Imaging: none since TPN start GI Surgeries / Procedures: 5/5 partial colectomy and cystoscopy with left ureteral stent placement  Central access: PICC placed 07/16/20 TPN start date: 07/16/20  Nutritional Goals (per RD rec on 5/4): kCal: 2150-2350, Protein: 120-135g, Fluid: >/= 2L per day  Current Nutrition:  TPN CLD - 0% meals charted Boost Breeze TID - 2 of 3 doses charted yesterday (reports nausea after taking with antiemetic given)  Plan:  Continue TPN at goal rate 90 mL/hr TPN provides 121g AA and 2160 kCal, meeting 100% of needs Electrolytes in TPN: Na 74mEq/L, K 70mEq/L, Ca 59mEq/L, Mg 19mEq/L, Phos 62mmol/L. Cl:Ac 1:1 Add standard MVI and trace elements to TPN Continue moderate SSI Q6H and monitor CBGs F/u AM labs, ability to tolerate PO and wean TPN F/U Zosyn  LOT   Arturo Morton, PharmD, BCPS Please check AMION for all [facility] contact numbers Clinical Pharmacist 07/19/2020 9:53 AM

## 2020-07-19 NOTE — Progress Notes (Signed)
Patient ID: Kristin Sawyer, female   DOB: 1980/10/07, 40 y.o.   MRN: [id number] [facility] Surgery Progress Note:   2 Days Post-Op  Subjective: Mental status is subdued but appropriate for circumstances.  Complaints -urinary dribbling when she walks is worse than pre infection.  . Objective: Vital signs in last 24 hours: Temp:  [98 F (36.7 C)-98.4 F (36.9 C)] 98 F (36.7 C) (05/07 0440) Pulse Rate:  [69-81] 74 (05/07 0440) Resp:  [16-18] 16 (05/07 0440) BP: (104-114)/(49-57) 106/49 (05/07 0440) SpO2:  [91 %-97 %] 91 % (05/07 0440)  Intake/Output from previous day: 05/06 0701 - 05/07 0700 In: 2844.9 [I.V.:2290.5; IV Piggyback:554.4] Out: 450 [Urine:400; Drains:50] Intake/Output this shift: No intake/output data recorded.  Physical Exam: Work of breathing is not labored.  Ostomy is viable and no gas or stool noted  Lab Results:  Results for orders placed or performed during the hospital encounter of 07/10/20 (from the past 48 hour(s))  Aerobic/Anaerobic Culture w Gram Stain (surgical/deep wound)     Status: None (Preliminary result)   Collection Time: 07/17/20 10:44 AM   Specimen: PATH Cytology Misc. fluid; Body Fluid  Result Value Ref Range   Specimen Description WOUND PERFORATED DIVERTICULITIS    Special Requests PERFORATED DIVERTICULITIS    Gram Stain      ABUNDANT WBC PRESENT, PREDOMINANTLY PMN NO ORGANISMS SEEN    Culture      RARE ESCHERICHIA COLI SUSCEPTIBILITIES TO FOLLOW Performed at [facility], [address], [place], [place] [address]    Report Status PENDING   Glucose, capillary     Status: Abnormal   Collection Time: 07/17/20  6:08 PM  Result Value Ref Range   Glucose-Capillary 189 (H) 70 - 99 mg/dL    Comment: Glucose reference range applies only to samples taken after fasting for at least 8 hours.  Glucose, capillary     Status: Abnormal   Collection Time: 07/17/20 11:22 PM  Result Value Ref Range   Glucose-Capillary 145 (H) 70 - 99  mg/dL    Comment: Glucose reference range applies only to samples taken after fasting for at least 8 hours.  Comprehensive metabolic panel     Status: Abnormal   Collection Time: 07/18/20  3:36 AM  Result Value Ref Range   Sodium 133 (L) 135 - 145 mmol/L   Potassium 4.5 3.5 - 5.1 mmol/L   Chloride 103 98 - 111 mmol/L   CO2 24 22 - 32 mmol/L   Glucose, Bld 122 (H) 70 - 99 mg/dL    Comment: Glucose reference range applies only to samples taken after fasting for at least 8 hours.   BUN 11 6 - 20 mg/dL   Creatinine, Ser 0.77 0.44 - 1.00 mg/dL   Calcium 8.8 (L) 8.9 - 10.3 mg/dL   Total Protein 6.1 (L) 6.5 - 8.1 g/dL   Albumin 2.5 (L) 3.5 - 5.0 g/dL   AST 30 15 - 41 U/L   ALT 27 0 - 44 U/L   Alkaline Phosphatase 48 38 - 126 U/L   Total Bilirubin 0.2 (L) 0.3 - 1.2 mg/dL   GFR, Estimated >60 >60 mL/min    Comment: (NOTE) Calculated using the CKD-EPI Creatinine Equation (2021)    Anion gap 6 5 - 15    Comment: Performed at [facility], [place] [address] [place], [place] [address]  Magnesium     Status: None   Collection Time: 07/18/20  3:36 AM  Result Value Ref Range  Magnesium 1.9 1.7 - 2.4 mg/dL    Comment: Performed at [facility], [place] [address], [place], [place] [address]  Phosphorus     Status: None   Collection Time: 07/18/20  3:36 AM  Result Value Ref Range   Phosphorus 4.0 2.5 - 4.6 mg/dL    Comment: Performed at [facility] [address] [place], [place] [address]  Glucose, capillary     Status: Abnormal   Collection Time: 07/18/20  5:59 AM  Result Value Ref Range   Glucose-Capillary 131 (H) 70 - 99 mg/dL    Comment: Glucose reference range applies only to samples taken after fasting for at least 8 hours.  Glucose, capillary     Status: Abnormal   Collection Time: 07/18/20 11:56 AM  Result Value Ref Range   Glucose-Capillary 112 (H) 70 - 99 mg/dL    Comment: Glucose reference range applies only to samples taken after fasting for at least 8 hours.   Glucose, capillary     Status: Abnormal   Collection Time: 07/18/20  6:17 PM  Result Value Ref Range   Glucose-Capillary 126 (H) 70 - 99 mg/dL    Comment: Glucose reference range applies only to samples taken after fasting for at least 8 hours.  Glucose, capillary     Status: Abnormal   Collection Time: 07/19/20 12:03 AM  Result Value Ref Range   Glucose-Capillary 138 (H) 70 - 99 mg/dL    Comment: Glucose reference range applies only to samples taken after fasting for at least 8 hours.  Glucose, capillary     Status: Abnormal   Collection Time: 07/19/20  6:11 AM  Result Value Ref Range   Glucose-Capillary 134 (H) 70 - 99 mg/dL    Comment: Glucose reference range applies only to samples taken after fasting for at least 8 hours.    Radiology/Results: DG Retrograde Pyelogram  Result Date: 07/17/2020 CLINICAL DATA:  Diverticulitis and left ureteral stent placement. EXAM: INTRAOPERATIVE LEFT RETROGRADE UROGRAPHY TECHNIQUE: Images were obtained with the C-arm fluoroscopic device intraoperatively and submitted for interpretation post-operatively. Please see the procedural report for the amount of contrast and the fluoroscopy time utilized. COMPARISON:  CT of the abdomen and pelvis on 07/13/2020 FINDINGS: Single intraoperative C-arm image demonstrates retrograde ureteral catheter placement to the level of the left renal collecting system with contrast injection demonstrating a normal appearance to the left renal collecting system. IMPRESSION: Normal appearance of left renal collecting system after retrograde opacification via ureteral catheter. Electronically Signed   By: Aletta Edouard M.D.   On: 07/17/2020 10:29    Anti-infectives: Anti-infectives (From admission, onward)   Start     Dose/Rate Route Frequency Ordered Stop   07/17/20 1045  piperacillin-tazobactam (ZOSYN) IVPB 3.375 g  Status:  Discontinued        3.375 g 12.5 mL/hr over 240 Minutes Intravenous To Surgery 07/17/20 1031 07/17/20  1402   07/10/20 1800  piperacillin-tazobactam (ZOSYN) IVPB 3.375 g        3.375 g 12.5 mL/hr over 240 Minutes Intravenous Every 8 hours 07/10/20 1246 07/21/20 2359   07/10/20 1115  piperacillin-tazobactam (ZOSYN) IVPB 3.375 g        3.375 g 100 mL/hr over 30 Minutes Intravenous  Once 07/10/20 1111 07/10/20 1210      Assessment/Plan: Problem List: Patient Active Problem List   Diagnosis Date Noted  . Diverticulitis of intestine with abscess 07/10/2020  . New daily persistent headache 05/31/2017  . Status migrainosus 05/31/2017  .  LGSIL on Pap smear of cervix 12/30/2016  . Low iron stores 06/15/2016  . Pernicious anemia 06/15/2016  . Vitamin D deficiency 06/15/2016  . Malnutrition of mild degree ([place]) 06/15/2016  . No energy 06/15/2016  . Depression, recurrent ([place]) 06/15/2016  . Low back pain 06/15/2016  . Morbid obesity ([place]) 06/15/2016  . Active labor 06/29/2014  . Spontaneous vaginal delivery 06/29/2014    Post Hartman procedure-no stool output-ileus 2 Days Post-Op    LOS: 9 days   Matt B. Hassell Done, MD, [facility] [phone] to reach the surgeon on call.    07/19/2020 9:29 AM

## 2020-07-20 LAB — BASIC METABOLIC PANEL
Anion gap: 6 (ref 5–15)
BUN: 21 mg/dL — ABNORMAL HIGH (ref 6–20)
CO2: 22 mmol/L (ref 22–32)
Calcium: 8.6 mg/dL — ABNORMAL LOW (ref 8.9–10.3)
Chloride: 107 mmol/L (ref 98–111)
Creatinine, Ser: 0.81 mg/dL (ref 0.44–1.00)
GFR, Estimated: >60 mL/min (ref >60)
Glucose, Bld: 107 mg/dL — ABNORMAL HIGH (ref 70–99)
Potassium: 4.5 mmol/L (ref 3.5–5.1)
Sodium: 135 mmol/L (ref 135–145)

## 2020-07-20 LAB — GLUCOSE, CAPILLARY
Glucose-Capillary: 121 mg/dL — ABNORMAL HIGH (ref 70–99)
Glucose-Capillary: 121 mg/dL — ABNORMAL HIGH (ref 70–99)
Glucose-Capillary: 107 mg/dL — ABNORMAL HIGH (ref 70–99)

## 2020-07-20 LAB — MAGNESIUM: Magnesium: 2.1 mg/dL (ref 1.7–2.4)

## 2020-07-20 LAB — PHOSPHORUS: Phosphorus: 4.4 mg/dL (ref 2.5–4.6)

## 2020-07-20 MED ORDER — SODIUM CHLORIDE 0.9 % IV SOLN
12.5000 mg | Freq: Four times a day (QID) | INTRAVENOUS | Status: DC | PRN
  Administered 2020-07-20: 12.5 mg via INTRAVENOUS
  Filled 2020-07-20 (×3): qty 0.5

## 2020-07-20 MED ORDER — SODIUM CHLORIDE 0.9 % IV SOLN
2.0000 g | Freq: Three times a day (TID) | INTRAVENOUS | Status: AC
  Administered 2020-07-20 – 2020-07-24 (×13): 2 g via INTRAVENOUS
  Filled 2020-07-20 (×14): qty 2

## 2020-07-20 MED ORDER — PROMETHAZINE HCL 12.5 MG RE SUPP
12.5000 mg | Freq: Four times a day (QID) | RECTAL | Status: DC | PRN
  Filled 2020-07-20: qty 1

## 2020-07-20 MED ORDER — PROMETHAZINE HCL 25 MG PO TABS
12.5000 mg | ORAL_TABLET | Freq: Four times a day (QID) | ORAL | Status: DC | PRN
  Administered 2020-07-22: 12.5 mg via ORAL
  Filled 2020-07-20: qty 1

## 2020-07-20 MED ORDER — INSULIN ASPART 100 UNIT/ML IJ SOLN
0.0000 [IU] | Freq: Three times a day (TID) | INTRAMUSCULAR | Status: DC
  Administered 2020-07-20: 2 [IU] via SUBCUTANEOUS

## 2020-07-20 NOTE — Plan of Care (Signed)

## 2020-07-20 NOTE — Progress Notes (Signed)
Physical Therapy Treatment Patient Details Name: Kristin Sawyer MRN: [id number] DOB: 03-15-1981 Today's Date: 07/20/2020    History of Present Illness Pt admit 4/28 with Sigmoid diverticulitis with perforation and 8.5x6.7x7.8 cm phlegmon.and VAc in place on abdomen.  5/5 for sigmoid colectomy and colostomy.PMH: obesity, smoker    PT Comments    Pt making steady progress towards her physical therapy goals; although pain and nausea/vomiting remain limiting factors. Pt ambulating x 25 feet with no assistive device at a min guard assist level. Requires assist for peri care with toileting. Education provided regarding progressive mobility and breathing techniques. Pt reports she will have adequate assist at home with mother. Will continue to progress as tolerated.     Follow Up Recommendations  Home health PT     Equipment Recommendations  Rolling walker with 5" wheels;3in1 (PT)    Recommendations for Other Services  OT consult     Precautions / Restrictions Precautions Precautions: Fall Precaution Comments: JP drain, VAC Restrictions Weight Bearing Restrictions: No    Mobility  Bed Mobility Overal bed mobility: Needs Assistance Bed Mobility: Rolling;Sidelying to Sit Rolling: Supervision Sidelying to sit: Supervision       General bed mobility comments: Cues for technique, use of bed rail, increased time/effort    Transfers Overall transfer level: Needs assistance Equipment used: None Transfers: Sit to/from Stand;Stand Pivot Transfers Sit to Stand: Supervision Stand pivot transfers: Min guard       General transfer comment: Supervision-min guard assist to rise from edge of bed and pivot to [facility]  Ambulation/Gait Ambulation/Gait assistance: Min guard Gait Distance (Feet): 25 Feet Assistive device: None Gait Pattern/deviations: Step-through pattern;Decreased stride length Gait velocity: decreased   General Gait Details: Slow and steady pace   Location manager    Modified Rankin (Stroke Patients Only)       Balance Overall balance assessment: Needs assistance Sitting-balance support: No upper extremity supported;Feet supported Sitting balance-Leahy Scale: Fair     Standing balance support: During functional activity;No upper extremity supported Standing balance-Leahy Scale: Fair                              Cognition Arousal/Alertness: Awake/alert Behavior During Therapy: Flat affect Overall Cognitive Status: Within Functional Limits for tasks assessed                                        Exercises      General Comments        Pertinent Vitals/Pain Pain Assessment: Faces Faces Pain Scale: Hurts whole lot Pain Location: abdomen Pain Descriptors / Indicators: Grimacing;Sharp Pain Intervention(s): Limited activity within patient's tolerance;Monitored during session    Home Living                      Prior Function            PT Goals (current goals can now be found in the care plan section) Acute Rehab PT Goals Patient Stated Goal: to go home PT Goal Formulation: With patient Time For Goal Achievement: 08/01/20 Potential to Achieve Goals: Good Progress towards PT goals: Progressing toward goals    Frequency    Min 3X/week      PT Plan Current plan remains appropriate    Co-evaluation  AM-PAC PT "6 Clicks" Mobility   Outcome Measure  Help needed turning from your back to your side while in a flat bed without using bedrails?: A Little Help needed moving from lying on your back to sitting on the side of a flat bed without using bedrails?: A Little Help needed moving to and from a bed to a chair (including a wheelchair)?: A Little Help needed standing up from a chair using your arms (e.g., wheelchair or bedside chair)?: A Little Help needed to walk in hospital room?: A Little Help needed climbing 3-5 steps with a railing?  : A Lot 6 Click Score: 17    End of Session Equipment Utilized During Treatment: Gait belt Activity Tolerance: Patient limited by pain Patient left: in chair;with call bell/phone within reach Nurse Communication: Mobility status PT Visit Diagnosis: Muscle weakness (generalized) (M62.81)     Time: 1165-7903 PT Time Calculation (min) (ACUTE ONLY): 27 min  Charges:  $Therapeutic Activity: 23-37 mins                     Wyona Almas, PT, DPT Acute Rehabilitation Services Pager [phone] Office [phone]    Deno Etienne 07/20/2020, 12:55 PM

## 2020-07-20 NOTE — Progress Notes (Signed)
Patient ID: Kristin Sawyer, female   DOB: [birth year], 40 y.o.   MRN: [id number] [facility] Surgery Progress Note:   3 Days Post-Op  Subjective: Mental status is more alert today and talkative.  Feels better .  Complaints nothing specific. Objective: Vital signs in last 24 hours: Temp:  [97.9 F (36.6 C)-98 F (36.7 C)] 97.9 F (36.6 C) (05/08 0332) Pulse Rate:  [66-68] 68 (05/08 0332) Resp:  [16-20] 16 (05/08 0332) BP: (105-119)/(42-59) 111/59 (05/08 0332) SpO2:  [95 %-98 %] 95 % (05/08 0332)  Intake/Output from previous day: 05/07 0701 - 05/08 0700 In: 3080.5 [P.O.:120; I.V.:2438.5; IV Piggyback:521.9] Out: 520 [Urine:300; Drains:220] Intake/Output this shift: No intake/output data recorded.  Physical Exam: Work of breathing is normal at rest.  Ostomy with flatus.    Lab Results:  Results for orders placed or performed during the hospital encounter of 07/10/20 (from the past 48 hour(s))  Glucose, capillary     Status: Abnormal   Collection Time: 07/18/20 11:56 AM  Result Value Ref Range   Glucose-Capillary 112 (H) 70 - 99 mg/dL    Comment: Glucose reference range applies only to samples taken after fasting for at least 8 hours.  Glucose, capillary     Status: Abnormal   Collection Time: 07/18/20  6:17 PM  Result Value Ref Range   Glucose-Capillary 126 (H) 70 - 99 mg/dL    Comment: Glucose reference range applies only to samples taken after fasting for at least 8 hours.  Glucose, capillary     Status: Abnormal   Collection Time: 07/19/20 12:03 AM  Result Value Ref Range   Glucose-Capillary 138 (H) 70 - 99 mg/dL    Comment: Glucose reference range applies only to samples taken after fasting for at least 8 hours.  Glucose, capillary     Status: Abnormal   Collection Time: 07/19/20  6:11 AM  Result Value Ref Range   Glucose-Capillary 134 (H) 70 - 99 mg/dL    Comment: Glucose reference range applies only to samples taken after fasting for at least 8 hours.  Glucose,  capillary     Status: Abnormal   Collection Time: 07/19/20 12:05 PM  Result Value Ref Range   Glucose-Capillary 123 (H) 70 - 99 mg/dL    Comment: Glucose reference range applies only to samples taken after fasting for at least 8 hours.  Glucose, capillary     Status: Abnormal   Collection Time: 07/19/20  5:10 PM  Result Value Ref Range   Glucose-Capillary 133 (H) 70 - 99 mg/dL    Comment: Glucose reference range applies only to samples taken after fasting for at least 8 hours.  Glucose, capillary     Status: Abnormal   Collection Time: 07/19/20 11:32 PM  Result Value Ref Range   Glucose-Capillary 116 (H) 70 - 99 mg/dL    Comment: Glucose reference range applies only to samples taken after fasting for at least 8 hours.  Basic metabolic panel     Status: Abnormal   Collection Time: 07/20/20  3:06 AM  Result Value Ref Range   Sodium 135 135 - 145 mmol/L   Potassium 4.5 3.5 - 5.1 mmol/L   Chloride 107 98 - 111 mmol/L   CO2 22 22 - 32 mmol/L   Glucose, Bld 107 (H) 70 - 99 mg/dL    Comment: Glucose reference range applies only to samples taken after fasting for at least 8 hours.   BUN 21 (H) 6 - 20 mg/dL   Creatinine, Ser  0.81 0.44 - 1.00 mg/dL   Calcium 8.6 (L) 8.9 - 10.3 mg/dL   GFR, Estimated >60 >60 mL/min    Comment: (NOTE) Calculated using the CKD-EPI Creatinine Equation (2021)    Anion gap 6 5 - 15    Comment: Performed at [facility] [address] [place], [place] [address]  Magnesium     Status: None   Collection Time: 07/20/20  3:06 AM  Result Value Ref Range   Magnesium 2.1 1.7 - 2.4 mg/dL    Comment: Performed at [facility] [address], [place], [place] [address]  Phosphorus     Status: None   Collection Time: 07/20/20  3:06 AM  Result Value Ref Range   Phosphorus 4.4 2.5 - 4.6 mg/dL    Comment: Performed at [facility] [address] [place], [place] [address]  Glucose, capillary     Status: Abnormal   Collection Time: 07/20/20  5:48  AM  Result Value Ref Range   Glucose-Capillary 121 (H) 70 - 99 mg/dL    Comment: Glucose reference range applies only to samples taken after fasting for at least 8 hours.    Radiology/Results: No results found.  Anti-infectives: Anti-infectives (From admission, onward)   Start     Dose/Rate Route Frequency Ordered Stop   07/17/20 1045  piperacillin-tazobactam (ZOSYN) IVPB 3.375 g  Status:  Discontinued        3.375 g 12.5 mL/hr over 240 Minutes Intravenous To Surgery 07/17/20 1031 07/17/20 1402   07/10/20 1800  piperacillin-tazobactam (ZOSYN) IVPB 3.375 g        3.375 g 12.5 mL/hr over 240 Minutes Intravenous Every 8 hours 07/10/20 1246 07/21/20 2359   07/10/20 1115  piperacillin-tazobactam (ZOSYN) IVPB 3.375 g        3.375 g 100 mL/hr over 30 Minutes Intravenous  Once 07/10/20 1111 07/10/20 1210      Assessment/Plan: Problem List: Patient Active Problem List   Diagnosis Date Noted  . Diverticulitis of intestine with abscess 07/10/2020  . New daily persistent headache 05/31/2017  . Status migrainosus 05/31/2017  . LGSIL on Pap smear of cervix 12/30/2016  . Low iron stores 06/15/2016  . Pernicious anemia 06/15/2016  . Vitamin D deficiency 06/15/2016  . Malnutrition of mild degree ([place]) 06/15/2016  . No energy 06/15/2016  . Depression, recurrent ([place]) 06/15/2016  . Low back pain 06/15/2016  . Morbid obesity ([place]) 06/15/2016  . Active labor 06/29/2014  . Spontaneous vaginal delivery 06/29/2014    Advance to full liquids today.   3 Days Post-Op    LOS: 10 days   Matt B. Hassell Done, MD, [facility] [phone] to reach the surgeon on call.    07/20/2020 9:07 AM

## 2020-07-20 NOTE — Progress Notes (Signed)
PHARMACY - TOTAL PARENTERAL NUTRITION CONSULT NOTE  Indication: malnutrition in the setting of persistent diverticulitis x 4 weeks and inadequate PO intake  Patient Measurements: Height: 5\' 3"  (160 cm) Weight: 133.1 kg (293 lb 6.9 oz) IBW/kg (Calculated) : 52.4 TPN AdjBW (KG): 72.6 Body mass index is 51.98 kg/m.  Assessment:  11 yof presenting 4/28 with sigmoid diverticulitis with perforation and phlegmon with recent hospitalization treated with IV abx. Surgery does not believe patient will do well on IV abx alone. Patient reports increased intermittent, sharp suprapubic abdominal pain, emesis x 1 overnight, ongoing nonbloody BMs becoming more formed. Plan to start TPN and consider surgery this admission. Patient reportedly with malnutrition x 4 weeks with inadequate PO intake.  Glucose / Insulin: no hx DM - CBGs controlled. Utilized 6 units SSI in last 24hrs Electrolytes: all stable WNL Renal: SCr < 1 stable, BUN up to 21 Hepatic: LFTs / Tbili WNL. TG 152. Albumin 2.5. Prealbumin low 7.2 Intake / Output; MIVF: UOP 0.1 ml/kg/hr per documentation, drain output 116mL/24hrs GI Imaging: none since TPN start GI Surgeries / Procedures: 5/5 partial colectomy and cystoscopy with left ureteral stent placement  Central access: PICC placed 07/16/20 TPN start date: 07/16/20  Nutritional Goals (per RD recommendation on 5/4): kCal: 2150-2350, Protein: 120-135g, Fluid: >/= 2L per day  Current Nutrition:  TPN Advance to FLD 5/8 Boost Breeze TID - none charted yesterday (pt reported nausea after taking 5/6 with antiemetic given)  Plan:  Wean TPN to off today per discussion with Surgery Hassell Done) Discussed plan with RN - at 1600, wean TPN to 1/2 rate (45 ml/hr) x 2 hrs, then stop TPN D/c TPN labs and nursing orders Change SSI to qAC F/u PPI IV q12h and Robaxin IV to PO as appropriate Change abx regimen to cefepime per discussion with Surgery, perforated diverticulitis wound cx is resistant to Zosyn  - f/u LOT   Arturo Morton, PharmD, BCPS Please check AMION for all [place] contact numbers Clinical Pharmacist 07/20/2020 9:12 AM

## 2020-07-21 LAB — CBC
HCT: 37.6 % (ref 36.0–46.0)
Hemoglobin: 12.1 g/dL (ref 12.0–15.0)
MCH: 28.9 pg (ref 26.0–34.0)
MCHC: 32.2 g/dL (ref 30.0–36.0)
MCV: 90 fL (ref 80.0–100.0)
Platelets: 537 10*3/uL — ABNORMAL HIGH (ref 150–400)
RBC: 4.18 MIL/uL (ref 3.87–5.11)
RDW: 14.3 % (ref 11.5–15.5)
WBC: 21.8 10*3/uL — ABNORMAL HIGH (ref 4.0–10.5)
nRBC: 0 % (ref 0.0–0.2)

## 2020-07-21 LAB — PHOSPHORUS: Phosphorus: 3.8 mg/dL (ref 2.5–4.6)

## 2020-07-21 LAB — MAGNESIUM: Magnesium: 1.8 mg/dL (ref 1.7–2.4)

## 2020-07-21 LAB — BASIC METABOLIC PANEL
Anion gap: 9 (ref 5–15)
BUN: 20 mg/dL (ref 6–20)
CO2: 19 mmol/L — ABNORMAL LOW (ref 22–32)
Calcium: 8.8 mg/dL — ABNORMAL LOW (ref 8.9–10.3)
Chloride: 108 mmol/L (ref 98–111)
Creatinine, Ser: 0.86 mg/dL (ref 0.44–1.00)
GFR, Estimated: >60 mL/min (ref >60)
Glucose, Bld: 112 mg/dL — ABNORMAL HIGH (ref 70–99)
Potassium: 4.4 mmol/L (ref 3.5–5.1)
Sodium: 136 mmol/L (ref 135–145)

## 2020-07-21 LAB — SURGICAL PATHOLOGY

## 2020-07-21 LAB — GLUCOSE, CAPILLARY: Glucose-Capillary: 108 mg/dL — ABNORMAL HIGH (ref 70–99)

## 2020-07-21 MED ORDER — HYDROMORPHONE HCL 1 MG/ML IJ SOLN
0.2500 mg | Freq: Three times a day (TID) | INTRAMUSCULAR | Status: DC | PRN
  Administered 2020-07-21 – 2020-07-23 (×4): 0.25 mg via INTRAVENOUS
  Filled 2020-07-21 (×5): qty 1

## 2020-07-21 MED ORDER — OXYCODONE HCL 5 MG PO TABS
5.0000 mg | ORAL_TABLET | ORAL | Status: DC | PRN
  Administered 2020-07-21 (×3): 10 mg via ORAL
  Administered 2020-07-21 – 2020-07-22 (×2): 5 mg via ORAL
  Administered 2020-07-22 – 2020-07-25 (×6): 10 mg via ORAL
  Filled 2020-07-21: qty 1
  Filled 2020-07-21 (×10): qty 2

## 2020-07-21 MED ORDER — HYDROXYZINE HCL 10 MG PO TABS
10.0000 mg | ORAL_TABLET | Freq: Three times a day (TID) | ORAL | Status: DC | PRN
  Administered 2020-07-21: 10 mg via ORAL
  Filled 2020-07-21 (×2): qty 1

## 2020-07-21 NOTE — TOC Initial Note (Addendum)
Transition of Care [facility]) - Initial/Assessment Note    Patient Details  Name: Kristin Sawyer MRN: [id number] Date of Birth: May 08, 1980  Transition of Care [facility]) CM/SW Contact:    Marilu Favre, RN Phone Number: 07/21/2020, 11:16 AM  Clinical Narrative:                 Spoke to patient at bedside. Confirmed face sheet information. Patient from home with 42 year old son.   Patient's parents currently taking care of her son.   Discussed home VAC. Home VAC ordered with Te[employer] with [employer]. Explained to patient if approved by her insurance home VAC , which is smaller and more  Portable than hospital VAC will be delivered to her hospital room prior to discharge along with a box of dressing supplies for home RN.   Patient's mother who is a NP plans to come stay with patient at discharge.   Per [facility] possible discharge tomorrow or Wednesday. NCM made patient aware.   No preference in home health agency .   Walker and 3 in 1 ordered with [facility].   Cory with [facility] checking to see if he can accept patient. Tommi Rumps accepted patient, start of care is Friday Jul 25, 2020   Vonzella Nipple with [facility] and requested bariatric size 2/2 body habitus walker and 3 in 1 .   Spoke to patient and her mother at bedside. Mother Ziana Heyliger cell [phone]. Vaughan Basta would like surgery to speak with her .   Patient wants to discharge late Thursday to her [place] address. Her mother who is a NP will change [facility] Friday Jul 25, 2020 and Monday Jul 28, 2020 and Wednesday Jul 30, 2020. Wednesday afternoon they plan to go to her parents address: [address], [place] [address]. They are requesting [facility] for [facility] to start [facility] dressing changes Friday Aug 01, 2020.   Brooke with [facility] aware of above and will discuss with patient in the morning.   NCM called [facility] spoke to the on call nurse will fax referral to [facility] at [phone] . On call nurse not sure if they are  in network with patient's insurance.   Patient and her mother aware. Vaughan Basta will call the "charge nurse" at [facility] also.     Expected Discharge Plan: [place]     Patient Goals and CMS Choice Patient states their goals for this hospitalization and ongoing recovery are:: to return to home CMS Medicare.gov Compare Post Acute Care list provided to:: Patient Choice offered to / list presented to : Patient  Expected Discharge Plan and Services Expected Discharge Plan: [place]   Discharge Planning Services: CM Consult Post Acute Care Choice: Home Health,Durable Medical Equipment Living arrangements for the past 2 months: Single Family Home                 DME Arranged: 3-N-1,Walker rolling DME Agency: [facility]       HH Arranged: RN,PT,OT          Prior Living Arrangements/Services Living arrangements for the past 2 months: Single Family Home Lives with:: Self Patient language and need for interpreter reviewed:: Yes Do you feel safe going back to the place where you live?: Yes      Need for Family Participation in Patient Care: Yes (Comment) Care giver support system in place?: Yes (comment)   Criminal Activity/Legal Involvement Pertinent to Current Situation/Hospitalization: No -  Comment as needed  Activities of Daily Living Home Assistive Devices/Equipment: None ADL Screening (condition at time of admission) Patient's cognitive ability adequate to safely complete daily activities?: Yes Is the patient deaf or have difficulty hearing?: No Does the patient have difficulty seeing, even when wearing glasses/contacts?: No Does the patient have difficulty concentrating, remembering, or making decisions?: No Patient able to express need for assistance with ADLs?: Yes Does the patient have difficulty dressing or bathing?: No Independently performs ADLs?: Yes (appropriate for developmental age) Does the patient have difficulty  walking or climbing stairs?: No Weakness of Legs: None Weakness of Arms/Hands: None  Permission Sought/Granted   Permission granted to share information with : No              Emotional Assessment Appearance:: Appears stated age Attitude/Demeanor/Rapport: Engaged Affect (typically observed): Tearful/Crying Orientation: : Oriented to Self,Oriented to Place,Oriented to  Time,Oriented to Situation Alcohol / Substance Use: Not Applicable Psych Involvement: No (comment)  Admission diagnosis:  Diverticulitis of intestine with abscess [K57.80] Patient Active Problem List   Diagnosis Date Noted  . Diverticulitis of intestine with abscess 07/10/2020  . New daily persistent headache 05/31/2017  . Status migrainosus 05/31/2017  . LGSIL on Pap smear of cervix 12/30/2016  . Low iron stores 06/15/2016  . Pernicious anemia 06/15/2016  . Vitamin D deficiency 06/15/2016  . Malnutrition of mild degree ([place]) 06/15/2016  . No energy 06/15/2016  . Depression, recurrent ([place]) 06/15/2016  . Low back pain 06/15/2016  . Morbid obesity ([place]) 06/15/2016  . Active labor 06/29/2014  . Spontaneous vaginal delivery 06/29/2014   PCP:  Donella Stade, PA-C Pharmacy:   [facility] [place] [address] Phone: [phone] Fax: [phone]     Social Determinants of Health (SDOH) Interventions    Readmission Risk Interventions No flowsheet data found.

## 2020-07-21 NOTE — Progress Notes (Signed)
[facility] Surgery Progress Note  4 Days Post-Op  Subjective: CC-  Abdomen sore, used mostly dilaudid for pain control yesterday. States that she vomited early yesterday morning, but has had no n/v since then. Colostomy productive. Tolerating full liquids. She did get OOB yesterday, worked with PT.   Objective: Vital signs in last 24 hours: Temp:  [97.7 F (36.5 C)-98.1 F (36.7 C)] 97.8 F (36.6 C) (05/09 0315) Pulse Rate:  [72-78] 72 (05/09 0315) Resp:  [15-18] 15 (05/09 0315) BP: (104-112)/(46-67) 106/46 (05/09 0315) SpO2:  [96 %-98 %] 97 % (05/09 0315) Last BM Date: 07/21/20  Intake/Output from previous day: 05/08 0701 - 05/09 0700 In: 1767.3 [P.O.:480; I.V.:959; IV Piggyback:328.3] Out: 175 [Emesis/NG output:75; Drains:100] Intake/Output this shift: Total I/O In: 357.6 [IV Piggyback:357.6] Out: 150 [Stool:150]  PE: Gen:  Alert, NAD Pulm:  rate and effort normal Abd: Soft, mild left abdominal TTP without rebound or guarding, ostomy viable and productive, open midline incision beefy red without erythema or purulent drainage, JP drain with serosanguinous fluid in bulb     Lab Results:  No results for input(s): WBC, HGB, HCT, PLT in the last 72 hours. BMET Recent Labs    07/20/20 0306  NA 135  K 4.5  CL 107  CO2 22  GLUCOSE 107*  BUN 21*  CREATININE 0.81  CALCIUM 8.6*   PT/INR No results for input(s): LABPROT, INR in the last 72 hours. CMP     Component Value Date/Time   NA 135 07/20/2020 0306   K 4.5 07/20/2020 0306   CL 107 07/20/2020 0306   CO2 22 07/20/2020 0306   GLUCOSE 107 (H) 07/20/2020 0306   BUN 21 (H) 07/20/2020 0306   CREATININE 0.81 07/20/2020 0306   CREATININE 0.80 06/14/2016 1035   CALCIUM 8.6 (L) 07/20/2020 0306   PROT 6.1 (L) 07/18/2020 0336   ALBUMIN 2.5 (L) 07/18/2020 0336   AST 30 07/18/2020 0336   ALT 27 07/18/2020 0336   ALKPHOS 48 07/18/2020 0336   BILITOT 0.2 (L) 07/18/2020 0336   GFRNONAA >60 07/20/2020 0306    Lipase     Component Value Date/Time   LIPASE 29 07/10/2020 0809       Studies/Results: No results found.  Anti-infectives: Anti-infectives (From admission, onward)   Start     Dose/Rate Route Frequency Ordered Stop   07/20/20 1030  ceFEPIme (MAXIPIME) 2 g in sodium chloride 0.9 % 100 mL IVPB        2 g 200 mL/hr over 30 Minutes Intravenous Every 8 hours 07/20/20 0939     07/17/20 1045  piperacillin-tazobactam (ZOSYN) IVPB 3.375 g  Status:  Discontinued        3.375 g 12.5 mL/hr over 240 Minutes Intravenous To Surgery 07/17/20 1031 07/17/20 1402   07/10/20 1800  piperacillin-tazobactam (ZOSYN) IVPB 3.375 g  Status:  Discontinued        3.375 g 12.5 mL/hr over 240 Minutes Intravenous Every 8 hours 07/10/20 1246 07/20/20 0939   07/10/20 1115  piperacillin-tazobactam (ZOSYN) IVPB 3.375 g        3.375 g 100 mL/hr over 30 Minutes Intravenous  Once 07/10/20 1111 07/10/20 1210       Assessment/Plan Morbid obesity Tobacco abuse - encourage cessation  Sigmoid diverticulitis, pelvic abscess -S/p exploratory laparotomy, segmental colectomy (sigmoid colon), abdominal washout, JP drain placement, colostomy creation 5/5 Dr. Bobbye Morton - POD#4 - surgical path pending - intraabdominal cx E coli resistant to pip/tazo, switched to cefepime 5/8 - WOC following for  ostomy education - abdominal wound vac MWF - continue JP drain, monitor output, currently serosanguinous - PT following, recommending HH PT. Continue to encourage mobilization - Home health PT/RN and DME orders placed including home wound vac - Advance to soft diet. Transition to oral pain medications.   ID - zosyn 4/28>>5/8, cefepime 5/8>>day#2 FEN - KVO IVF, soft diet, Boost VTE - SCDs, lovenox Foley - none Follow up - Dr. Bobbye Morton, ostomy clinic   LOS: 11 days    Wellington Hampshire, [facility] Surgery 07/21/2020, 9:17 AM Please see Amion for pager number during day hours 7:00am-4:30pm

## 2020-07-21 NOTE — Progress Notes (Signed)
Physical Therapy Treatment Patient Details Name: Kristin Sawyer MRN: [id number] DOB: [birth year] Today's Date: 07/21/2020    History of Present Illness Pt admit 4/28 with Sigmoid diverticulitis with perforation and 8.5x6.7x7.8 cm phlegmon.and vac in place on abdomen.  5/5 for sigmoid colectomy and colostomy. PMH: obesity, smoker.    PT Comments    Pt received in supine, mildly anxious throughout but agreeable to therapy session and with good participation and tolerance for stair, transfer and gait training. Of note, pt has regular RW recently delivered to room but will need bariatric size 2/2 body habitus and pt tending to keep wide stance post-op, case manager notified. Pt also needs to sit up in recliner chair today or tomorrow for at least >1 hour as she plans to discharge to parent's home which is >1 hour away. Pt continues to benefit from PT services to progress toward functional mobility goals. Continue to recommend HHPT, anticipate pt safe to DC home with PRN supervision/assist from family once medically cleared and proper DME delivered.   Follow Up Recommendations  Home health PT     Equipment Recommendations  Rolling walker with 5" wheels;3in1 (PT);Other (comment) (bariatric)    Recommendations for Other Services       Precautions / Restrictions Precautions Precautions: Fall Precaution Comments: JP drain, VAC Restrictions Weight Bearing Restrictions: No    Mobility  Bed Mobility Overal bed mobility: Modified Independent Bed Mobility: Supine to Sit;Sit to Supine     Supine to sit: Modified independent (Device/Increase time);HOB elevated Sit to supine: Modified independent (Device/Increase time);HOB elevated   General bed mobility comments: Pt used railings and elevated HOB, cues for log roll and pt performed partial log roll    Transfers Overall transfer level: Needs assistance Equipment used: Rolling walker (2 wheeled) Transfers: Sit to/from Stand Sit to Stand:  Supervision         General transfer comment: sup for safety, no LOB but pt prefers pushing up on RW rather than EOB despite cues  Ambulation/Gait Ambulation/Gait assistance: Supervision Gait Distance (Feet): 20 Feet (x2 with seated break) Assistive device: Rolling walker (2 wheeled) Gait Pattern/deviations: Step-through pattern;Decreased stride length Gait velocity: decreased   General Gait Details: Slow and steady pace   Stairs Stairs: Yes Stairs assistance: Min guard Stair Management: With walker;Forwards;Step to pattern Number of Stairs: 3 (7" step x3 reps) General stair comments: pt given cues for sequencing and activity pacing, no LOB, using RW to simulate railings. may go to mother's house which only has 1-2 steps per pt.   Wheelchair Mobility    Modified Rankin (Stroke Patients Only)       Balance Overall balance assessment: Needs assistance Sitting-balance support: No upper extremity supported;Feet supported Sitting balance-Leahy Scale: Good Sitting balance - Comments: Pt could sit EOB without assist but c/o abdominal tightness with seated posture   Standing balance support: During functional activity;No upper extremity supported Standing balance-Leahy Scale: Fair Standing balance comment: relies on UE support and RW due to anxiety more than balance                            Cognition Arousal/Alertness: Awake/alert Behavior During Therapy: Anxious Overall Cognitive Status: Within Functional Limits for tasks assessed                                 General Comments: Pt intermittently tearful at times; very anxious, per RN  she had PRN anxiety meds earlier. Her mother is present and encouraging      Exercises General Exercises - Lower Extremity Ankle Circles/Pumps: AROM;Both;10 reps;Seated Long Arc Quad: AROM;Both;10 reps;Seated Hip ABduction/ADduction: Other (comment);AROM;Both;5 reps;Seated (hip adduction with pillow, defer  more reps 2/2 tightness)    General Comments General comments (skin integrity, edema, etc.): VSS on RA      Pertinent Vitals/Pain Pain Assessment: Faces Faces Pain Scale: Hurts a little bit Pain Location: abdomen Pain Descriptors / Indicators: Tightness;Grimacing;Guarding Pain Intervention(s): Limited activity within patient's tolerance;Monitored during session;Premedicated before session;Repositioned;Other (comment) (pt defers ice pack reports "it's more just tightness than pain")    Home Living Family/patient expects to be discharged to:: Private residence Living Arrangements: Children (76 y/o son) Available Help at Discharge: Family;Available PRN/intermittently Type of Home: House Home Access: Stairs to enter Entrance Stairs-Rails: None Home Layout: One level Home Equipment: None      Prior Function Level of Independence: Independent      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   PT Goals (current goals can now be found in the care plan section) Acute Rehab PT Goals Patient Stated Goal: to go home PT Goal Formulation: With patient Time For Goal Achievement: 08/01/20 Potential to Achieve Goals: Good Progress towards PT goals: Progressing toward goals    Frequency    Min 3X/week      PT Plan Current plan remains appropriate       AM-PAC PT "6 Clicks" Mobility   Outcome Measure  Help needed turning from your back to your side while in a flat bed without using bedrails?: A Little Help needed moving from lying on your back to sitting on the side of a flat bed without using bedrails?: A Little (will likely need more assist for flat bed no rails) Help needed moving to and from a bed to a chair (including a wheelchair)?: A Little Help needed standing up from a chair using your arms (e.g., wheelchair or bedside chair)?: A Little Help needed to walk in hospital room?: A Little Help needed climbing 3-5 steps with a railing? : A Little 6 Click Score: 18    End of Session    Activity Tolerance: Other (comment);Patient tolerated treatment well (pt anxious) Patient left: in bed;with call bell/phone within reach;with family/visitor present (pt seated EOB with Mom present to wash her hair in shower cap) Nurse Communication: Mobility status;Other (comment) (pt needs bariatric recliner chair for room) PT Visit Diagnosis: Muscle weakness (generalized) (M62.81)     Time: 7062-3762 PT Time Calculation (min) (ACUTE ONLY): 21 min  Charges:  $Gait Training: 8-22 mins                     Ellamae Lybeck P., PTA Acute Rehabilitation Services Pager: [phone] Office: [place] 07/21/2020, 4:10 PM

## 2020-07-21 NOTE — Consult Note (Signed)
[facility] Nurse Consult Note: Reason for Consult:First post op NPWT (VAC) dressing change. Brooke, PA at bedside to assess.  Wound type:Midline abdominal  Pressure Injury POA: NA Measurement: 15 cm x 7 cm x 10 cm  Wound GMW:NUUVO red and moist Drainage (amount, consistency, odor) moderate serosanguinous  No odor.  Periwound:LMQ Colostomy located directly at umbilicus, in 2 cm deep crease.    Dressing procedure/placement/frequency: Cleanse midline abdominal wound with NS and pat dry  2 pieces black foam applied to wound  Covered with drape with barrier ring at umbilicus,  Seal immediately achieved. CHange Mon/Wed/Fri.  WOC Nurse ostomy consult note Stoma type/location: LMQ colostomy, located near umbilicus, in 2 cm deep creasing.  Presurgical stomal markings were not used.  Stomal assessment/size: oval 3 cm, x 1.5 cm flush stoma.   Peristomal assessment: Deep creasing, umbilicus.  Stoma belt will not fit patient.  Will request an XL belt for home and [facility] will assist with fitting. HAve implemented flexible convex pouch with barrier ring.   Treatment options for stomal/peristomal skin: barrier ring, flexible convex pouch  I am cutting pouch off center to avoid overlapping NPWT dressing in case of pouch leakage.  Output soft brown stool present on pouch Ostomy pouching: 1pc. Convex with barrier ring  Needs ostomy belt. I have reached out to [facility] rep for XL belt if possible while inpatient.  Education provided: Patient is moaning and in pain.  She is not receptive to learning.  She is tearful about her large open abdominal Emotional support provided.  Explained how NPWT works and the benefits, including faster healing times.  Encouraged increased protein intake. She verbalizes understanding.   Enrolled patient in [place] program: No Will follow.  Domenic Moras MSN, RN, FNP-BC CWON Wound, Ostomy, Continence Nurse Pager [phone]

## 2020-07-21 NOTE — Evaluation (Signed)
Occupational Therapy Evaluation Patient Details Name: Kristin Sawyer MRN: [id number] DOB: 1980/10/12 Today's Date: 07/21/2020    History of Present Illness Pt admit 4/28 with Sigmoid diverticulitis with perforation and 8.5x6.7x7.8 cm phlegmon.and VAc in place on abdomen.  5/5 for sigmoid colectomy and colostomy.PMH: obesity, smoker   Clinical Impression   Pt admitted to the ED for concerns and procedure listed above. PTA pt reported that she lives with her 40 y/o son, works an Marketing executive job, and is independent with all ADL's and IADL's. During the evaluation pt was very anxious and tearful about her situation, however she was motivated to ambulate and complete basic ADL's. Pt is supervision level for all functional mobility and ADL performance for safety. Pt is expected to progress back to her base line of independence quickly. Acute OT will follow up to assist with progress.     Follow Up Recommendations  No OT follow up    Equipment Recommendations  3 in 1 bedside commode (Bariatric size)    Recommendations for Other Services       Precautions / Restrictions Precautions Precautions: Fall Precaution Comments: JP drain, VAC Restrictions Weight Bearing Restrictions: No      Mobility Bed Mobility Overal bed mobility: Modified Independent Bed Mobility: Supine to Sit;Sit to Supine     Supine to sit: Modified independent (Device/Increase time);HOB elevated Sit to supine: Modified independent (Device/Increase time);HOB elevated   General bed mobility comments: Pt used railings and elevated HOB    Transfers Overall transfer level: Needs assistance Equipment used: None Transfers: Sit to/from Stand Sit to Stand: Supervision         General transfer comment: sup for safety    Balance Overall balance assessment: Needs assistance Sitting-balance support: No upper extremity supported;Feet supported Sitting balance-Leahy Scale: Good Sitting balance - Comments: Pt could sit EOB  without assist   Standing balance support: During functional activity;No upper extremity supported Standing balance-Leahy Scale: Fair                             ADL either performed or assessed with clinical judgement   ADL Overall ADL's : Needs assistance/impaired Eating/Feeding: Independent;Sitting   Grooming: Oral care;Wash/dry hands;Wash/dry face;Standing;Supervision/safety Grooming Details (indicate cue type and reason): completed at sink Upper Body Bathing: Supervision/ safety;Sitting Upper Body Bathing Details (indicate cue type and reason): sup for safety Lower Body Bathing: Supervison/ safety;Sitting/lateral leans;Sit to/from stand Lower Body Bathing Details (indicate cue type and reason): sup for safety Upper Body Dressing : Independent;Sitting Upper Body Dressing Details (indicate cue type and reason): completed EOB Lower Body Dressing: Supervision/safety;Sitting/lateral leans;Sit to/from stand Lower Body Dressing Details (indicate cue type and reason): Pt can don and doff socks and underwear using figure 4 position. She has a little difficulty bending down due to tightness in her abdomen. Toilet Transfer: Supervision/safety;Ambulation;Regular Glass blower/designer Details (indicate cue type and reason): completed in bathroom on handicap height toilet Toileting- Clothing Manipulation and Hygiene: Supervision/safety;Sitting/lateral lean;Sit to/from stand Toileting - Clothing Manipulation Details (indicate cue type and reason): sup for safety, completed in bathroom, sitting and standing from toilet Tub/ Shower Transfer: Supervision/safety;Ambulation Tub/Shower Transfer Details (indicate cue type and reason): sup for safety, will benefit from shower seat Functional mobility during ADLs: Supervision/safety General ADL Comments: Pt gait is slow due to pain and anxiety. No LoB. Able to complete ADL's at supervision level for safety     Vision Baseline  Vision/History: No visual deficits Patient Visual Report: No  change from baseline Vision Assessment?: No apparent visual deficits     Perception Perception Perception Tested?: No   Praxis Praxis Praxis tested?: Not tested    Pertinent Vitals/Pain Pain Assessment: Faces Faces Pain Scale: Hurts a little bit Pain Location: abdomen Pain Descriptors / Indicators: Tightness;Grimacing;Guarding Pain Intervention(s): Limited activity within patient's tolerance;Repositioned;Monitored during session     Hand Dominance Right   Extremity/Trunk Assessment Upper Extremity Assessment Upper Extremity Assessment: Overall WFL for tasks assessed   Lower Extremity Assessment Lower Extremity Assessment: Defer to PT evaluation   Cervical / Trunk Assessment Cervical / Trunk Assessment: Normal   Communication Communication Communication: No difficulties   Cognition Arousal/Alertness: Awake/alert Behavior During Therapy: Anxious Overall Cognitive Status: Within Functional Limits for tasks assessed                                 General Comments: Pt is very anxious about her situation and is having a difficult time calming down.   General Comments  VSS on RA. Pt practicing pursed lip breathing due to anxiety at this time.    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         OT Problem List: Decreased strength;Decreased activity tolerance;Impaired balance (sitting and/or standing);Pain;Decreased coordination      OT Treatment/Interventions: Self-care/ADL training;Therapeutic exercise;Energy conservation;Therapeutic activities;Patient/family education;Balance training    OT Goals(Current goals can be found in the care plan section) Acute Rehab OT Goals Patient Stated Goal: to go home OT Goal Formulation: With patient Time For Goal Achievement: 08/04/20 Potential to Achieve Goals: Good ADL Goals Pt Will Perform Grooming: Independently;standing Pt Will Perform Lower Body Bathing: with modified independence;sitting/lateral leans;sit to/from stand Pt Will Perform Lower Body Dressing: with modified independence;sitting/lateral leans;sit to/from stand Pt Will Transfer to Toilet: Independently;ambulating;regular height toilet  OT Frequency: Min 2X/week   Barriers to D/C:            Co-evaluation              AM-PAC OT "6 Clicks" Daily Activity     Outcome Measure Help from another person eating meals?: None Help from another person taking care of personal grooming?: A Little Help from another person toileting, which includes using toliet, bedpan, or urinal?: A Little Help from another person bathing (including washing, rinsing, drying)?: A Little Help from another person to put on and taking off regular upper body clothing?: None Help from another person to put on and taking off regular lower body clothing?: A Little 6 Click Score: 20   End of Session Equipment Utilized During Treatment: Gait belt Nurse Communication: Mobility status;Other (comment) (Pt's ostomy was leaking into her wound site.)  Activity Tolerance: Patient tolerated treatment well Patient left: in bed;with call bell/phone within reach  OT Visit Diagnosis: Unsteadiness on feet (R26.81);Muscle weakness (generalized) (M62.81)  Time: 7035-0093 OT Time Calculation  (min): 32 min Charges:  OT General Charges $OT Visit: 1 Visit OT Evaluation $OT Eval Moderate Complexity: 1 Mod OT Treatments $Self Care/Home Management : 8-22 mins  Kristin Rhude H., OTR/L Acute Rehabilitation  Kristin Sawyer Kristin Sawyer 07/21/2020, 1:34 PM

## 2020-07-22 ENCOUNTER — Inpatient Hospital Stay (HOSPITAL_COMMUNITY): Payer: No Typology Code available for payment source

## 2020-07-22 LAB — AEROBIC/ANAEROBIC CULTURE W GRAM STAIN (SURGICAL/DEEP WOUND)

## 2020-07-22 LAB — URINALYSIS, COMPLETE (UACMP) WITH MICROSCOPIC
Bacteria, UA: NONE SEEN
Bilirubin Urine: NEGATIVE
Glucose, UA: NEGATIVE mg/dL
Ketones, ur: NEGATIVE mg/dL
Leukocytes,Ua: NEGATIVE
Nitrite: NEGATIVE
Protein, ur: NEGATIVE mg/dL
Specific Gravity, Urine: 1.017 (ref 1.005–1.030)
pH: 5 (ref 5.0–8.0)

## 2020-07-22 LAB — CBC
HCT: 34.6 % — ABNORMAL LOW (ref 36.0–46.0)
Hemoglobin: 11.1 g/dL — ABNORMAL LOW (ref 12.0–15.0)
MCH: 28.7 pg (ref 26.0–34.0)
MCHC: 32.1 g/dL (ref 30.0–36.0)
MCV: 89.4 fL (ref 80.0–100.0)
Platelets: 452 10*3/uL — ABNORMAL HIGH (ref 150–400)
RBC: 3.87 MIL/uL (ref 3.87–5.11)
RDW: 14.2 % (ref 11.5–15.5)
WBC: 20.6 10*3/uL — ABNORMAL HIGH (ref 4.0–10.5)
nRBC: 0 % (ref 0.0–0.2)

## 2020-07-22 MED ORDER — ENSURE ENLIVE PO LIQD
237.0000 mL | Freq: Two times a day (BID) | ORAL | Status: DC
  Administered 2020-07-23 – 2020-07-25 (×6): 237 mL via ORAL

## 2020-07-22 MED ORDER — IOHEXOL 300 MG/ML  SOLN
75.0000 mL | Freq: Once | INTRAMUSCULAR | Status: AC | PRN
  Administered 2020-07-22: 75 mL via INTRAVENOUS

## 2020-07-22 MED ORDER — PANTOPRAZOLE SODIUM 40 MG PO TBEC
40.0000 mg | DELAYED_RELEASE_TABLET | Freq: Two times a day (BID) | ORAL | Status: DC
  Administered 2020-07-22 – 2020-07-25 (×6): 40 mg via ORAL
  Filled 2020-07-22 (×6): qty 1

## 2020-07-22 MED ORDER — IOHEXOL 9 MG/ML PO SOLN
ORAL | Status: AC
  Administered 2020-07-22: 500 mL
  Filled 2020-07-22: qty 1000

## 2020-07-22 MED ORDER — BUSPIRONE HCL 5 MG PO TABS
10.0000 mg | ORAL_TABLET | Freq: Three times a day (TID) | ORAL | Status: DC
  Administered 2020-07-22 – 2020-07-25 (×8): 10 mg via ORAL
  Filled 2020-07-22 (×9): qty 2

## 2020-07-22 NOTE — Progress Notes (Signed)
[facility] Progress Note  5 Days Post-Op  Subjective: CC-  Patient's mother present via phone today. Tearful at times. States that her colostomy pouch leaked several times yesterday and wound vac had to be changed multiple times because of the leaking. She doesn't want to get OOB because she's afraid this will leak again. Concerned about being able to manage this at home. States that her abdominal pain is stable. Pain medication helps. Denies n/v. Tolerating diet.   Objective: Vital signs in last 24 hours: Temp:  [98.5 F (36.9 C)-98.7 F (37.1 C)] 98.5 F (36.9 C) (05/10 0347) Pulse Rate:  [78-87] 78 (05/10 0347) Resp:  [17-18] 17 (05/10 0347) BP: (108-109)/(57-59) 109/59 (05/10 0347) SpO2:  [93 %-97 %] 97 % (05/10 0347) Last BM Date: 07/21/20  Intake/Output from previous day: 05/09 0701 - 05/10 0700 In: 1437.7 [P.O.:480; IV Piggyback:957.7] Out: 880 [Urine:200; Drains:130; Stool:550] Intake/Output this shift: No intake/output data recorded.  PE: Gen:  Alert, NAD Pulm:  rate and effort normal Abd: Soft, mild left abdominal TTP without rebound or guarding, new ostomy pouch in place, vac to midline incision with good seal, JP drain with serosanguinous fluid in bulb   Lab Results:  Recent Labs    07/21/20 0833 07/22/20 0338  WBC 21.8* 20.6*  HGB 12.1 11.1*  HCT 37.6 34.6*  PLT 537* 452*   BMET Recent Labs    07/20/20 0306 07/21/20 0833  NA 135 136  K 4.5 4.4  CL 107 108  CO2 22 19*  GLUCOSE 107* 112*  BUN 21* 20  CREATININE 0.81 0.86  CALCIUM 8.6* 8.8*   PT/INR No results for input(s): LABPROT, INR in the last 72 hours. CMP     Component Value Date/Time   NA 136 07/21/2020 0833   K 4.4 07/21/2020 0833   CL 108 07/21/2020 0833   CO2 19 (L) 07/21/2020 0833   GLUCOSE 112 (H) 07/21/2020 0833   BUN 20 07/21/2020 0833   CREATININE 0.86 07/21/2020 0833   CREATININE 0.80 06/14/2016 1035   CALCIUM 8.8 (L) 07/21/2020 0833   PROT 6.1 (L)  07/18/2020 0336   ALBUMIN 2.5 (L) 07/18/2020 0336   AST 30 07/18/2020 0336   ALT 27 07/18/2020 0336   ALKPHOS 48 07/18/2020 0336   BILITOT 0.2 (L) 07/18/2020 0336   GFRNONAA >60 07/21/2020 0833   Lipase     Component Value Date/Time   LIPASE 29 07/10/2020 0809       Studies/Results: No results found.  Anti-infectives: Anti-infectives (From admission, onward)   Start     Dose/Rate Route Frequency Ordered Stop   07/20/20 1030  ceFEPIme (MAXIPIME) 2 g in sodium chloride 0.9 % 100 mL IVPB        2 g 200 mL/hr over 30 Minutes Intravenous Every 8 hours 07/20/20 0939     07/17/20 1045  piperacillin-tazobactam (ZOSYN) IVPB 3.375 g  Status:  Discontinued        3.375 g 12.5 mL/hr over 240 Minutes Intravenous To Surgery 07/17/20 1031 07/17/20 1402   07/10/20 1800  piperacillin-tazobactam (ZOSYN) IVPB 3.375 g  Status:  Discontinued        3.375 g 12.5 mL/hr over 240 Minutes Intravenous Every 8 hours 07/10/20 1246 07/20/20 0939   07/10/20 1115  piperacillin-tazobactam (ZOSYN) IVPB 3.375 g        3.375 g 100 mL/hr over 30 Minutes Intravenous  Once 07/10/20 1111 07/10/20 1210       Assessment/Plan Morbid obesity Tobacco abuse - encourage  cessation  Sigmoid diverticulitis, pelvic abscess -S/p exploratory laparotomy, segmental colectomy (sigmoid colon), abdominal washout, JP drain placement, colostomy creation 5/5 Dr. Bobbye Morton - POD#5 - surgical path: Diverticulosis, transmural defect, four morphologically benign lymph nodes - intraabdominal cx E coli resistant to pip/tazo, switched to cefepime 5/8 - WOC following for ostomy education - abdominal wound vac MWF - continue JP drain, monitor output, currently serosanguinous - PT following, recommending HH PT. Continue to encourage mobilization - Home health PT/RN and DME orders previously placed including home wound vac  ID - zosyn 4/28>>5/8, cefepime 5/8>>day#3 FEN - KVO IVF, soft diet, Boost VTE - SCDs, lovenox Foley -  none Follow up - Dr. Bobbye Morton, ostomy clinic  Plan: With persistent leukocytosis will obtain CT scan today. Will ask [facility] nurse to see for assistance with leaking ostomy appliance. Otherwise continue plan as above.   LOS: 12 days    [place], [facility] 07/22/2020, 9:25 AM Please see Amion for pager number during day hours 7:00am-4:30pm

## 2020-07-22 NOTE — Progress Notes (Signed)
Notified Brooke PA of wound vac sucking stool into wound, and WOC RN not available. Per Jerene Pitch remove wound vac and apply wet to dry dressing to wound. Patient refused removal of wound vac at this time. Per patient, she was just seen by Dr. Bobbye Morton and wants to keep wound vac in place until tomorrow for scheduled dressing change. Will continue to monitor for any excessive stool leakage.

## 2020-07-22 NOTE — Consult Note (Addendum)
[facility] Nurse ostomy follow up Checked on patient per request from bedside RN and PA Brooke with [facility]. Patient states she had a terrible time with leakage last night and the wound vac sucking stool into the wound requiring a change of the pouch and the vac several times. Per Jerene Pitch PA, okay to DC Vac and change to wet/dry BID dressing changes if vac continues to pull stool into the wound. Patient skin is becoming irritated with the numerous vac and pouch changes. Ordered stoma powder Kellie Simmering # 6) and skin barrier wipes Kellie Simmering # [id number]) to use with next change. [place] sent to bedside RN Ubaldo Glassing to page me if she starts to leak again.  Cathlean Marseilles Tamala Julian, MSN, RN, [place], Lysle Pearl, [facility] Wound Treatment Associate Pager [phone]

## 2020-07-22 NOTE — Progress Notes (Signed)
Nutrition Follow-up  DOCUMENTATION CODES:   Morbid obesity  INTERVENTION:   Ensure Enlive po BID, each supplement provides 350 kcal and 20 grams of protein  D/C Boost Breeze  Provide colostomy diet ed prior to d/c   NUTRITION DIAGNOSIS:   Increased nutrient needs related to acute illness (diverticular abscess) as evidenced by estimated needs.  Being addressed via diet advancement, supplements  GOAL:   Patient will meet greater than or equal to 90% of their needs  Progressing  MONITOR:   PO intake,Supplement acceptance  REASON FOR ASSESSMENT:   Consult New TPN/TNA  ASSESSMENT:   40 yo female admitted with sigmoid diverticulitis with perforation and phlegmon. PMH includes obesity, tobacco abuse, diverticulitis, vitamin B-12 deficiency.   5/04 - TPN initiated. 5/05 - S/P sigmoid colectomy and colostomy. 5/08 - FL diet, TPN discontinued 5/09 - Diet advanced to SOFT  Noted CT abdomen/pelvis pending for today given concern for abdominal infection/abscess  Pt received TPN for only 4 days, goal rate x 2 days  Currently on Soft diet, recorded po 50%  Noted pt colostomy pouch leaked multiple times yesterday, does not want to get OOB due to concern it will leak again.   Wound VAC with little to no drainage, JP drain 130 mL  Labs: reviewed Meds: reviewed  Diet Order:   Diet Order            DIET SOFT Room service appropriate? Yes; Fluid consistency: Thin  Diet effective now                 EDUCATION NEEDS:   Not appropriate for education at this time  Skin:  Skin Assessment: Skin Integrity Issues: Skin Integrity Issues:: Incisions,Other (Comment) Incisions: abdomen with wound VAC in place Other: irritation around osotmy site  Last BM:  5/9 colostomy  Height:   Ht Readings from Last 1 Encounters:  07/17/20 5\' 3"  (1.6 m)    Weight:   Wt Readings from Last 1 Encounters:  07/17/20 133.1 kg    Ideal Body Weight:  52.3 kg  BMI:  Body mass  index is 51.98 kg/m.  Estimated Nutritional Needs:   Kcal:  2150-2350  Protein:  120-135 gm  Fluid:  >/= 2 L   Kerman Passey MS, RDN, LDN, CNSC Registered Dietitian III Clinical Nutrition RD Pager and On-Call Pager Number Located in [place]

## 2020-07-22 NOTE — Consult Note (Signed)
IR received request to evaluate patient for possible exchange/upsize of the patient's current surgical drain. Imaging reviewed by IR and the collection seen on today's CT imaging is currently too small to allow for aspiration or drain placement.   This information was relayed directly to Dr. Bobbye Morton. No IR procedure planned and the order will be deleted.   Please call IR if we can be of assistance in the future.  Soyla Dryer, [place] [phone] 07/22/2020, 4:33 PM

## 2020-07-22 NOTE — Care Management (Addendum)
[place] [phone]. [facility] is reviewing referral and will call back with a determination.   Called Tracey with [facility] to discuss patient's plan also. She is aware of the plan. [facility] will need wound measurements documented every 1 to 2 weeks. If no home health confirmed in [place], patient will need to have measurements done in surgeon's office.    Cory with [facility] aware of plan.   Kasey with [facility] accepted the referral. She will need update on wound care and discharge summary   Magdalen Spatz RN

## 2020-07-23 LAB — CBC
HCT: 34.4 % — ABNORMAL LOW (ref 36.0–46.0)
Hemoglobin: 11.1 g/dL — ABNORMAL LOW (ref 12.0–15.0)
MCH: 28.8 pg (ref 26.0–34.0)
MCHC: 32.3 g/dL (ref 30.0–36.0)
MCV: 89.4 fL (ref 80.0–100.0)
Platelets: 454 10*3/uL — ABNORMAL HIGH (ref 150–400)
RBC: 3.85 MIL/uL — ABNORMAL LOW (ref 3.87–5.11)
RDW: 14.2 % (ref 11.5–15.5)
WBC: 15.9 10*3/uL — ABNORMAL HIGH (ref 4.0–10.5)
nRBC: 0 % (ref 0.0–0.2)

## 2020-07-23 LAB — BASIC METABOLIC PANEL
Anion gap: 5 (ref 5–15)
BUN: 10 mg/dL (ref 6–20)
CO2: 21 mmol/L — ABNORMAL LOW (ref 22–32)
Calcium: 8.7 mg/dL — ABNORMAL LOW (ref 8.9–10.3)
Chloride: 110 mmol/L (ref 98–111)
Creatinine, Ser: 0.8 mg/dL (ref 0.44–1.00)
GFR, Estimated: >60 mL/min (ref >60)
Glucose, Bld: 99 mg/dL (ref 70–99)
Potassium: 3.9 mmol/L (ref 3.5–5.1)
Sodium: 136 mmol/L (ref 135–145)

## 2020-07-23 MED ORDER — HYDROMORPHONE HCL 1 MG/ML IJ SOLN
0.2500 mg | Freq: Two times a day (BID) | INTRAMUSCULAR | Status: DC | PRN
  Administered 2020-07-24 – 2020-07-25 (×3): 0.25 mg via INTRAVENOUS
  Filled 2020-07-23 (×3): qty 1

## 2020-07-23 NOTE — Progress Notes (Signed)
Physical Therapy Treatment Patient Details Name: Kristin Sawyer MRN: [id number] DOB: [birth year] Today's Date: 07/23/2020    History of Present Illness Pt admit 4/28 with Sigmoid diverticulitis with perforation and 8.5x6.7x7.8 cm phlegmon.and vac in place on abdomen.  5/5 for sigmoid colectomy and colostomy. PMH: obesity, smoker.    PT Comments    Pt awake in bed and agreeable to participate with therapy. Pt made significant improvement with ambulation distance this session. She reports she has been sitting up for >1hr already today and would like to return to bed for now. Plan for stairs next session as pt is likely to d/c on 5/13 (per pt). Will continue to follow acutely.     Follow Up Recommendations  Home health PT     Equipment Recommendations  Rolling walker with 5" wheels;3in1 (PT);Other (comment) (bariatric)    Recommendations for Other Services       Precautions / Restrictions Precautions Precautions: Fall Precaution Comments: JP drain    Mobility  Bed Mobility Overal bed mobility: Modified Independent                  Transfers Overall transfer level: Needs assistance Equipment used: Rolling walker (2 wheeled) Transfers: Sit to/from Stand Sit to Stand: Supervision         General transfer comment: supervision for safety. Assist with line management only  Ambulation/Gait Ambulation/Gait assistance: Min guard;Supervision Gait Distance (Feet): 225 Feet Assistive device: Rolling walker (2 wheeled) Gait Pattern/deviations: Step-through pattern;Decreased stride length Gait velocity: decreased   General Gait Details: Slow and steady pace   Chief Strategy Officer    Modified Rankin (Stroke Patients Only)       Balance Overall balance assessment: Needs assistance Sitting-balance support: No upper extremity supported;Feet supported Sitting balance-Leahy Scale: Good     Standing balance support: During functional  activity;No upper extremity supported Standing balance-Leahy Scale: Fair Standing balance comment: relies on UE support and RW due to anxiety more than balance                            Cognition Arousal/Alertness: Awake/alert Behavior During Therapy: WFL for tasks assessed/performed Overall Cognitive Status: Within Functional Limits for tasks assessed                                 General Comments: Tearful when speaking of her mother and all her assistance. Overall less anxious than previous sessions.      Exercises      General Comments        Pertinent Vitals/Pain Pain Assessment: Faces Faces Pain Scale: Hurts even more Pain Location: abdomen Pain Descriptors / Indicators: Tightness;Grimacing;Guarding;Operative site guarding;Tender Pain Intervention(s): Monitored during session;Limited activity within patient's tolerance;Premedicated before session    Home Living                      Prior Function            PT Goals (current goals can now be found in the care plan section) Acute Rehab PT Goals Patient Stated Goal: to go home PT Goal Formulation: With patient Time For Goal Achievement: 08/01/20 Potential to Achieve Goals: Good Progress towards PT goals: Progressing toward goals    Frequency    Min 3X/week      PT Plan Current plan remains  appropriate    Co-evaluation              AM-PAC PT "6 Clicks" Mobility   Outcome Measure  Help needed turning from your back to your side while in a flat bed without using bedrails?: A Little Help needed moving from lying on your back to sitting on the side of a flat bed without using bedrails?: A Little (will likely need more assist for flat bed no rails) Help needed moving to and from a bed to a chair (including a wheelchair)?: A Little Help needed standing up from a chair using your arms (e.g., wheelchair or bedside chair)?: A Little Help needed to walk in hospital room?:  A Little Help needed climbing 3-5 steps with a railing? : A Little 6 Click Score: 18    End of Session Equipment Utilized During Treatment: Gait belt Activity Tolerance: Patient tolerated treatment well Patient left: in bed;with call bell/phone within reach Nurse Communication: Mobility status PT Visit Diagnosis: Muscle weakness (generalized) (M62.81)     Time: 2694-8546 PT Time Calculation (min) (ACUTE ONLY): 23 min  Charges:  $Gait Training: 23-37 mins                     Benjiman Core, [place] Pager [phone] Acute Rehab   Allena Katz 07/23/2020, 11:27 AM

## 2020-07-23 NOTE — TOC Transition Note (Addendum)
Transition of Care [facility]) - CM/SW Discharge Note   Patient Details  Name: Amylynn Fano MRN: [id number] Date of Birth: March 04, 1981  Transition of Care [facility] CM/SW Contact:  Bethena Roys, RN Phone Number: 07/23/2020, 3:13 PM   Clinical Narrative: Case Manager was asked to assist with IV antibiotic therapy for this patient. Case Manager reached out to [place] @ [phone] with [facility] to confirm home health services. Patient has RN services- and added PT as well. [facility] is aware that the patient will not need vac therapy- patient will have wet-to-dry, ostomy care, and IV antibiotics. Tracey with [facility] is aware. [facility] office is agreeable to services. Will need home health orders and F2F once stable to transition home. Case Manager reached out to [facility] with [facility] to see if the agency can service IV antibiotic therapy. [facility] is now following for IV needs. Case Manager spoke with the patient and she will stay in [place] until May 18 at her home address [place] [address] with the assistance of her mother. After 07-30-20,  the patient will return to her mother's address in [place] and [facility] will visit the patient initially on 08-01-20. [facility] in [place] will cover IV antibiotics for RN services as a bridge until the patient gets to [place]. Patient has durable medical equipment in the room. Case Manager will continue to follow for additional needs.      07-23-20 1608 Training session is scheduled for Friday at 0830 with the mother for wound care and ostomy care. Pam with [facility] will also provide education for IV antibiotic therapy.   Final next level of care: [place]   Patient Goals and CMS Choice Patient states their goals for this hospitalization and ongoing recovery are:: to return to home CMS Medicare.gov Compare Post Acute Care list provided to:: Patient Choice offered to / list  presented to : Patient   Discharge Plan and Services   Discharge Planning Services: CM Consult Post Acute Care Choice: Home Health,Durable Medical Equipment          DME Arranged: 3-N-1,Walker rolling DME Agency: [employer]       HH Arranged: RN,PT,OT     Readmission Risk Interventions No flowsheet data found.

## 2020-07-23 NOTE — Consult Note (Addendum)
[facility] Nurse ostomy follow up Patient receiving care in [place]. Stoma type/location:  LUQ colostomy Stomal assessment/size: 5 cm side to side, 4 cm up and down. MCS at 3 o'clock approximately 1.3 cm in depth Peristomal assessment: red, irritated Treatment options for stomal/peristomal skin: 2 barrier rings Output: approximately 75 ml of thin green/brown stool in pouch Ostomy pouching: 1pc. Convex  Education provided: Patient MUST use ostomy belt at all times. Enrolled patient in [place] Start Discharge program: No Mother of patient to be here Friday 5/13 at 0830 to perform ostomy pouch change. Val Riles, RN, MSN, CWOCN, CNS-BC, pager [phone]

## 2020-07-23 NOTE — Progress Notes (Signed)
[facility] Progress Note  6 Days Post-Op  Subjective: CC-  Abdominal pain about the same. Tolerating diet but not eating a lot. States that she vomited once yesterday because of the smell of the colostomy. Otherwise no n/v. Colostomy productive.  CT yesterday showed loculated fluid collection versus developing abscess in the upper pelvis. WBC down 15.9, afebrile  Objective: Vital signs in last 24 hours: Temp:  [97.9 F (36.6 C)-98.4 F (36.9 C)] 97.9 F (36.6 C) (05/11 0609) Pulse Rate:  [74-78] 78 (05/11 0609) Resp:  [17-19] 19 (05/11 0609) BP: (96-110)/(46-56) 106/56 (05/11 0609) SpO2:  [96 %-98 %] 98 % (05/11 0609) Last BM Date: 07/23/20  Intake/Output from previous day: 05/10 0701 - 05/11 0700 In: -  Out: 1028 [Urine:900; Drains:28; Stool:100] Intake/Output this shift: Total I/O In: -  Out: 550 [Urine:400; Stool:150]  PE: Gen: Alert, NAD Pulm: rate and effort normal Abd: Soft,mild left abdominal TTP without rebound or guarding, ostomy viable with some mucocutaneous separation at the 3 o'clock position, open midline wound with false bottom identified after vac removal, JP drain with serosanguinous fluid in bulb     Lab Results:  Recent Labs    07/22/20 0338 07/23/20 0349  WBC 20.6* 15.9*  HGB 11.1* 11.1*  HCT 34.6* 34.4*  PLT 452* 454*   BMET Recent Labs    07/21/20 0833 07/23/20 0349  NA 136 136  K 4.4 3.9  CL 108 110  CO2 19* 21*  GLUCOSE 112* 99  BUN 20 10  CREATININE 0.86 0.80  CALCIUM 8.8* 8.7*   PT/INR No results for input(s): LABPROT, INR in the last 72 hours. CMP     Component Value Date/Time   NA 136 07/23/2020 0349   K 3.9 07/23/2020 0349   CL 110 07/23/2020 0349   CO2 21 (L) 07/23/2020 0349   GLUCOSE 99 07/23/2020 0349   BUN 10 07/23/2020 0349   CREATININE 0.80 07/23/2020 0349   CREATININE 0.80 06/14/2016 1035   CALCIUM 8.7 (L) 07/23/2020 0349   PROT 6.1 (L) 07/18/2020 0336   ALBUMIN 2.5 (L) 07/18/2020 0336    AST 30 07/18/2020 0336   ALT 27 07/18/2020 0336   ALKPHOS 48 07/18/2020 0336   BILITOT 0.2 (L) 07/18/2020 0336   GFRNONAA >60 07/23/2020 0349   Lipase     Component Value Date/Time   LIPASE 29 07/10/2020 0809       Studies/Results: CT ABDOMEN PELVIS W CONTRAST  Result Date: 07/22/2020 CLINICAL DATA:  Increased pain around the wound. The patient underwent a partial colectomy and end colostomy Jeanette Caprice procedure) for acute diverticulitis on 07/17/2020. EXAM: CT ABDOMEN AND PELVIS WITH CONTRAST TECHNIQUE: Multidetector CT imaging of the abdomen and pelvis was performed using the standard protocol following bolus administration of intravenous contrast. CONTRAST:  43mL OMNIPAQUE IOHEXOL 300 MG/ML  SOLN COMPARISON:  CT abdomen pelvis dated 07/13/2020 FINDINGS: Lower chest: There is mild-to-moderate bibasilar atelectasis. Hepatobiliary: No focal liver abnormality is seen. No gallstones, gallbladder wall thickening, or biliary dilatation. Pancreas: Unremarkable. No pancreatic ductal dilatation or surrounding inflammatory changes. Spleen: Normal in size without focal abnormality. Adrenals/Urinary Tract: Adrenal glands are unremarkable. Kidneys are normal, without renal calculi, focal lesion, or hydronephrosis. Wall thickening of the superior and left aspect of the bladder is likely reactive and not significantly changed since 07/13/2020. Stomach/Bowel: Stomach is within normal limits. The patient is status post a sigmoid colectomy with end colostomy in the left mid abdomen. Enteric contrast is seen in the rectum and there is  no evidence of leakage from the rectal pouch. There is subcutaneous fat stranding around the end colostomy, however no well-defined, drainable fluid collection is identified to suggest abscess near the ostomy. Enteric contrast reaches the transverse colon. Appendix appears normal. No evidence of bowel wall thickening, distention, or inflammatory changes. A surgical drain enters the  right lower quadrant and loops through the operative bed near where the sigmoid colon was previously located. A fluid collection in the upper pelvis measures 2.7 x 4.2 (series 6, image 68) x 1.5 cm (series 3, image 77). This may represent loculated fluid versus developing abscess. A 1.4 cm fluid attenuating lesion adjacent to the uterine fundus and left adnexa (series 3, image 73 and series 6, image 72) is surrounded by soft tissue attenuation. It is unclear if this represents a left ovarian cyst or an intraperitoneal fluid collection with a thick wall. Vascular/Lymphatic: No significant vascular findings are present. Reactive retroperitoneal and mesenteric lymph nodes are redemonstrated. Reproductive: See above in the stomach/bowel subsection. Other: No abdominal wall hernia. Musculoskeletal: No acute or significant osseous findings. IMPRESSION: 1. Loculated fluid collection versus developing abscess in the upper pelvis. 2. Fluid attenuating lesion adjacent to the uterine fundus and left adnexa may represent an intraperitoneal fluid collection with a thick wall versus a left ovarian cyst. Electronically Signed   By: Zerita Boers M.D.   On: 07/22/2020 16:10    Anti-infectives: Anti-infectives (From admission, onward)   Start     Dose/Rate Route Frequency Ordered Stop   07/20/20 1030  ceFEPIme (MAXIPIME) 2 g in sodium chloride 0.9 % 100 mL IVPB        2 g 200 mL/hr over 30 Minutes Intravenous Every 8 hours 07/20/20 0939     07/17/20 1045  piperacillin-tazobactam (ZOSYN) IVPB 3.375 g  Status:  Discontinued        3.375 g 12.5 mL/hr over 240 Minutes Intravenous To Surgery 07/17/20 1031 07/17/20 1402   07/10/20 1800  piperacillin-tazobactam (ZOSYN) IVPB 3.375 g  Status:  Discontinued        3.375 g 12.5 mL/hr over 240 Minutes Intravenous Every 8 hours 07/10/20 1246 07/20/20 0939   07/10/20 1115  piperacillin-tazobactam (ZOSYN) IVPB 3.375 g        3.375 g 100 mL/hr over 30 Minutes Intravenous  Once  07/10/20 1111 07/10/20 1210       Assessment/Plan Morbid obesity Tobacco abuse - encourage cessation  Sigmoid diverticulitis, pelvic abscess -S/pexploratory laparotomy, segmental colectomy (sigmoid colon), abdominal washout, JP drain placement, colostomy creation5/5 Dr. Bobbye Morton -POD#6 - surgical path: Diverticulosis, transmural defect, four morphologically benign lymph nodes - intraabdominal cx E coli resistant to pip/tazo, switched to cefepime 5/8 - WOC following for ostomy education - CT 5/10 showed loculated fluid collection versus developing abscess in the upper pelvis. Reviewed by IR, collection too small to allow for image guided aspiration or drain placement. Continue surgical drain - WBC down 15.9, afebrile - d/c wound vac 5/11 and switch to BID wet to dry dressing changes - will need IV antibiotics at home for about 2 weeks, I have reached out to case management for assistance arranging this - patient's mother plans to come Friday for colostomy and wound care education, patient going to see if she can come sooner. Once home health is arranged and patient's family comfortable with wound care she may be ready for discharge as early as the end of this week.   ID -zosyn 4/28>>5/8, cefepime 5/8>>day#4 FEN -KVO IVF, soft diet, Boost VTE -SCDs,  lovenox Foley -none Follow up -Dr. Bobbye Morton, ostomy clinic    LOS: 13 days    [place], [facility] 07/23/2020, 9:38 AM Please see [facility] for pager number during day hours 7:00am-4:30pm

## 2020-07-23 NOTE — Consult Note (Signed)
[facility] Nurse ostomy follow up Convex ostomy pouch with 2 barrier rings and belt placed this morning remains intact without leakage. I HAVE ENROLLED THE PATIENT IN THE [place] SECURE START PROGRAM and provided the patient with an education folder. Val Riles, RN, MSN, CWOCN, CNS-BC, pager [phone]

## 2020-07-24 LAB — GLUCOSE, CAPILLARY
Glucose-Capillary: 120 mg/dL — ABNORMAL HIGH (ref 70–99)
Glucose-Capillary: 115 mg/dL — ABNORMAL HIGH (ref 70–99)

## 2020-07-24 LAB — CREATININE, SERUM
Creatinine, Ser: 0.88 mg/dL (ref 0.44–1.00)
GFR, Estimated: >60 mL/min (ref >60)

## 2020-07-24 MED ORDER — CEFTRIAXONE IV (FOR PTA / DISCHARGE USE ONLY): 2.0000 g | INTRAVENOUS | 0 refills | Status: AC

## 2020-07-24 MED ORDER — METHOCARBAMOL 500 MG PO TABS
1000.0000 mg | ORAL_TABLET | Freq: Four times a day (QID) | ORAL | Status: DC | PRN
  Administered 2020-07-24: 1000 mg via ORAL
  Filled 2020-07-24: qty 2

## 2020-07-24 MED ORDER — ACETAMINOPHEN 500 MG PO TABS: 1000.0000 mg | ORAL_TABLET | Freq: Four times a day (QID) | ORAL | 0 refills | Status: DC | PRN

## 2020-07-24 MED ORDER — SODIUM CHLORIDE 0.9 % IV SOLN
2.0000 g | INTRAVENOUS | Status: DC
  Administered 2020-07-25: 2 g via INTRAVENOUS
  Filled 2020-07-24: qty 20
  Filled 2020-07-24: qty 2

## 2020-07-24 MED ORDER — CALCIUM CARBONATE ANTACID 500 MG PO CHEW: 1.0000 | CHEWABLE_TABLET | Freq: Four times a day (QID) | ORAL | Status: DC | PRN

## 2020-07-24 NOTE — Progress Notes (Signed)
Physical Therapy Treatment Patient Details Name: Kristin Sawyer MRN: [id number] DOB: Jan 14, 1981 Today's Date: 07/24/2020    History of Present Illness Pt admit 4/28 with Sigmoid diverticulitis with perforation and 8.5x6.7x7.8 cm phlegmon.and vac in place on abdomen.  5/5 for sigmoid colectomy and colostomy. PMH: obesity, smoker.    PT Comments    Pt is demonstrating some tolerances for gait but has pain on R hip from stiffness and a mild strain pt sustained recently.  Follow along with her to prepare for home, and is expected to get there in the next day.  May benefit from another trip on the stairs tomorrow if PT is seeing her.  See for goals of acute PT.   Follow Up Recommendations  Home health PT     Equipment Recommendations  Rolling walker with 5" wheels;3in1 (PT);Other (comment)    Recommendations for Other Services       Precautions / Restrictions Precautions Precautions: Fall Precaution Comments: JP drain Restrictions Weight Bearing Restrictions: No    Mobility  Bed Mobility Overal bed mobility: Modified Independent Bed Mobility: Sit to Supine Rolling: Supervision     Sit to supine: Modified independent (Device/Increase time);HOB elevated   General bed mobility comments: HOB elevated    Transfers Overall transfer level: Modified independent Equipment used: Rolling walker (2 wheeled) Transfers: Sit to/from Stand Sit to Stand: Modified independent (Device/Increase time) Stand pivot transfers: Min guard          Ambulation/Gait Ambulation/Gait assistance: Min guard Gait Distance (Feet): 200 Feet Assistive device: Rolling walker (2 wheeled) Gait Pattern/deviations: Step-through pattern;Decreased stride length Gait velocity: decreased Gait velocity interpretation: <1.31 ft/sec, indicative of household ambulator General Gait Details: Slow and steady pace   Stairs Stairs: Yes Stairs assistance: Min guard Stair Management: One rail  Right;Forwards;Step to pattern Number of Stairs: 3 General stair comments: pt given cues for sequencing and activity pacing, no LOB, using RW to simulate railings. may go to mother's house which only has 1-2 steps per pt.   Wheelchair Mobility    Modified Rankin (Stroke Patients Only)       Balance Overall balance assessment: Independent;No apparent balance deficits (not formally assessed) Sitting-balance support: No upper extremity supported;Feet supported Sitting balance-Leahy Scale: Normal Sitting balance - Comments: Pt could sit EOB without assist but c/o abdominal tightness with seated posture Postural control: Posterior lean Standing balance support: During functional activity;No upper extremity supported Standing balance-Leahy Scale: Good                              Cognition Arousal/Alertness: Awake/alert Behavior During Therapy: WFL for tasks assessed/performed Overall Cognitive Status: Within Functional Limits for tasks assessed                                 General Comments: Pt was anxious until completing stairs with PT      Exercises      General Comments General comments (skin integrity, edema, etc.): tolerating walk and stairs with good respect      Pertinent Vitals/Pain Pain Assessment: Faces Faces Pain Scale: Hurts even more Pain Location: abdomen Pain Descriptors / Indicators: Sore;Discomfort Pain Intervention(s): Limited activity within patient's tolerance;Repositioned    Home Living                      Prior Function  PT Goals (current goals can now be found in the care plan section) Acute Rehab PT Goals Patient Stated Goal: to go home Progress towards PT goals: Progressing toward goals    Frequency    Min 3X/week      PT Plan Current plan remains appropriate    Co-evaluation              AM-PAC PT "6 Clicks" Mobility   Outcome Measure  Help needed turning from your back  to your side while in a flat bed without using bedrails?: A Little Help needed moving from lying on your back to sitting on the side of a flat bed without using bedrails?: A Little Help needed moving to and from a bed to a chair (including a wheelchair)?: A Little Help needed standing up from a chair using your arms (e.g., wheelchair or bedside chair)?: A Little Help needed to walk in hospital room?: A Little Help needed climbing 3-5 steps with a railing? : A Little 6 Click Score: 18    End of Session Equipment Utilized During Treatment: Gait belt Activity Tolerance: Patient tolerated treatment well Patient left: in bed;with call bell/phone within reach Nurse Communication: Mobility status PT Visit Diagnosis: Muscle weakness (generalized) (M62.81)     Time: 1535-1601 PT Time Calculation (min) (ACUTE ONLY): 26 min  Charges:  $Gait Training: 8-22 mins $Therapeutic Activity: 8-22 mins                    Ramond Dial 07/24/2020, 8:31 PM  Mee Hives, PT MS Acute Rehab Dept. Number: [place] [facility] [place]

## 2020-07-24 NOTE — Progress Notes (Signed)
PHARMACY CONSULT NOTE FOR:  OUTPATIENT  PARENTERAL ANTIBIOTIC THERAPY (OPAT)  Indication: Intra-abdominal infection Regimen: ceftriaxone 2g IV q24h End date: 08/08/2020  IV antibiotic discharge orders are pended. To discharging provider:  please sign these orders via discharge navigator,  Select New Orders & click on the button choice - Manage This Unsigned Work.     Thank you for allowing pharmacy to be a part of this patient's care.  Phillis Haggis 07/24/2020, 10:57 AM

## 2020-07-24 NOTE — Plan of Care (Signed)

## 2020-07-24 NOTE — Progress Notes (Signed)
[facility] Progress Note  7 Days Post-Op  Subjective: CC-  Sitting up in side of bed. States that colostomy pouch did not leak at all yesterday. She is feeling comfortable emptying it independently. Anxious to go home. Aiming for d/c tomorrow. Tolerating diet. Vomited again yesterday but thinks it is a combination of reflux and the smell from the colostomy.   Objective: Vital signs in last 24 hours: Temp:  [98.1 F (36.7 C)-98.4 F (36.9 C)] 98.1 F (36.7 C) (05/12 0635) Pulse Rate:  [76-86] 76 (05/12 0635) Resp:  [18-20] 18 (05/12 0635) BP: (108-122)/(52-72) 122/52 (05/12 0635) SpO2:  [98 %-100 %] 98 % (05/12 0635) Last BM Date: 07/24/20  Intake/Output from previous day: 05/11 0701 - 05/12 0700 In: 300 [IV Piggyback:300] Out: [place] [Urine:800; Emesis/NG output:300; Drains:90; Stool:650] Intake/Output this shift: No intake/output data recorded.  PE: Gen: Alert, NAD Pulm: rate and effort normal Abd: Soft,nontender,ostomy difficult to visualize through pouch/ liquid stool in pouch, open midline wound with cdi dressing in place (per pt this was just changed earlier this morning), JP drain with serosanguinous fluid in bulb  Lab Results:  Recent Labs    07/22/20 0338 07/23/20 0349  WBC 20.6* 15.9*  HGB 11.1* 11.1*  HCT 34.6* 34.4*  PLT 452* 454*   BMET Recent Labs    07/23/20 0349 07/24/20 0350  NA 136  --   K 3.9  --   CL 110  --   CO2 21*  --   GLUCOSE 99  --   BUN 10  --   CREATININE 0.80 0.88  CALCIUM 8.7*  --    PT/INR No results for input(s): LABPROT, INR in the last 72 hours. CMP     Component Value Date/Time   NA 136 07/23/2020 0349   K 3.9 07/23/2020 0349   CL 110 07/23/2020 0349   CO2 21 (L) 07/23/2020 0349   GLUCOSE 99 07/23/2020 0349   BUN 10 07/23/2020 0349   CREATININE 0.88 07/24/2020 0350   CREATININE 0.80 06/14/2016 1035   CALCIUM 8.7 (L) 07/23/2020 0349   PROT 6.1 (L) 07/18/2020 0336   ALBUMIN 2.5 (L) 07/18/2020 0336    AST 30 07/18/2020 0336   ALT 27 07/18/2020 0336   ALKPHOS 48 07/18/2020 0336   BILITOT 0.2 (L) 07/18/2020 0336   GFRNONAA >60 07/24/2020 0350   Lipase     Component Value Date/Time   LIPASE 29 07/10/2020 0809       Studies/Results: CT ABDOMEN PELVIS W CONTRAST  Result Date: 07/22/2020 CLINICAL DATA:  Increased pain around the wound. The patient underwent a partial colectomy and end colostomy Jeanette Caprice procedure) for acute diverticulitis on 07/17/2020. EXAM: CT ABDOMEN AND PELVIS WITH CONTRAST TECHNIQUE: Multidetector CT imaging of the abdomen and pelvis was performed using the standard protocol following bolus administration of intravenous contrast. CONTRAST:  78mL OMNIPAQUE IOHEXOL 300 MG/ML  SOLN COMPARISON:  CT abdomen pelvis dated 07/13/2020 FINDINGS: Lower chest: There is mild-to-moderate bibasilar atelectasis. Hepatobiliary: No focal liver abnormality is seen. No gallstones, gallbladder wall thickening, or biliary dilatation. Pancreas: Unremarkable. No pancreatic ductal dilatation or surrounding inflammatory changes. Spleen: Normal in size without focal abnormality. Adrenals/Urinary Tract: Adrenal glands are unremarkable. Kidneys are normal, without renal calculi, focal lesion, or hydronephrosis. Wall thickening of the superior and left aspect of the bladder is likely reactive and not significantly changed since 07/13/2020. Stomach/Bowel: Stomach is within normal limits. The patient is status post a sigmoid colectomy with end colostomy in the left mid abdomen.  Enteric contrast is seen in the rectum and there is no evidence of leakage from the rectal pouch. There is subcutaneous fat stranding around the end colostomy, however no well-defined, drainable fluid collection is identified to suggest abscess near the ostomy. Enteric contrast reaches the transverse colon. Appendix appears normal. No evidence of bowel wall thickening, distention, or inflammatory changes. A surgical drain enters the  right lower quadrant and loops through the operative bed near where the sigmoid colon was previously located. A fluid collection in the upper pelvis measures 2.7 x 4.2 (series 6, image 68) x 1.5 cm (series 3, image 77). This may represent loculated fluid versus developing abscess. A 1.4 cm fluid attenuating lesion adjacent to the uterine fundus and left adnexa (series 3, image 73 and series 6, image 72) is surrounded by soft tissue attenuation. It is unclear if this represents a left ovarian cyst or an intraperitoneal fluid collection with a thick wall. Vascular/Lymphatic: No significant vascular findings are present. Reactive retroperitoneal and mesenteric lymph nodes are redemonstrated. Reproductive: See above in the stomach/bowel subsection. Other: No abdominal wall hernia. Musculoskeletal: No acute or significant osseous findings. IMPRESSION: 1. Loculated fluid collection versus developing abscess in the upper pelvis. 2. Fluid attenuating lesion adjacent to the uterine fundus and left adnexa may represent an intraperitoneal fluid collection with a thick wall versus a left ovarian cyst. Electronically Signed   By: Zerita Boers M.D.   On: 07/22/2020 16:10    Anti-infectives: Anti-infectives (From admission, onward)   Start     Dose/Rate Route Frequency Ordered Stop   07/20/20 1030  ceFEPIme (MAXIPIME) 2 g in sodium chloride 0.9 % 100 mL IVPB        2 g 200 mL/hr over 30 Minutes Intravenous Every 8 hours 07/20/20 0939     07/17/20 1045  piperacillin-tazobactam (ZOSYN) IVPB 3.375 g  Status:  Discontinued        3.375 g 12.5 mL/hr over 240 Minutes Intravenous To Surgery 07/17/20 1031 07/17/20 1402   07/10/20 1800  piperacillin-tazobactam (ZOSYN) IVPB 3.375 g  Status:  Discontinued        3.375 g 12.5 mL/hr over 240 Minutes Intravenous Every 8 hours 07/10/20 1246 07/20/20 0939   07/10/20 1115  piperacillin-tazobactam (ZOSYN) IVPB 3.375 g        3.375 g 100 mL/hr over 30 Minutes Intravenous  Once  07/10/20 1111 07/10/20 1210       Assessment/Plan Morbid obesity Tobacco abuse - encourage cessation  Sigmoid diverticulitis, pelvic abscess -S/pexploratory laparotomy, segmental colectomy (sigmoid colon), abdominal washout, JP drain placement, colostomy creation5/5 Dr. Bobbye Morton -POD#7 - surgical path:Diverticulosis, transmural defect, four morphologically benign lymph nodes - intraabdominal cx E coli resistant to pip/tazo, switched to cefepime 5/8 - WOC following for ostomy education - CT 5/10 showed loculated fluid collection versus developing abscess in the upper pelvis. Reviewed by IR, collection too small to allow for image guided aspiration or drain placement. Continue surgical drain - d/c wound vac 5/11 and switch to BID wet to dry dressing changes - will need IV antibiotics at home for about 2 weeks, case management working on this - Meeting with patient, her mother, and [place] tomorrow morning 0830 for for colostomy and wound care education. If this goes well and all home health is arranged, then she will likely be ready for discharge tomorrow.  ID -zosyn 4/28>>5/8, cefepime 5/8>>day#5 FEN -KVO IVF, soft diet, Boost VTE -SCDs, lovenox Foley -none Follow up -Dr. Bobbye Morton, ostomy clinic    LOS:  14 days    [facility] 07/24/2020, 10:09 AM Please see Amion for pager number during day hours 7:00am-4:30pm

## 2020-07-24 NOTE — Discharge Summary (Signed)
[facility] Discharge Summary   Patient ID: Kristin Sawyer MRN: [id number] DOB/AGE: 1980/10/19 40 y.o.  Admit date: 07/10/2020 Discharge date: 07/25/2020  Admitting Diagnosis: Sigmoid diverticulitis with perforation and 8.5x6.7x7.8 cm phlegmon  Morbid obesity Tobacco abuse   Discharge Diagnosis Sigmoid diverticulitis, pelvic abscess S/pexploratory laparotomy, segmental colectomy (sigmoid colon), abdominal washout, JP drain placement, colostomy creation  Consultants Urology Interventional radiology  Imaging: No results found.  Procedures #1. Dr. Bobbye Morton (07/17/2020) - Exploratory laparotomy, segmental colectomy (sigmoid colon), abdominal washout, JP drain placement, colostomy creation #2. Dr. Alinda Money (07/17/2020) - Cystoscopy, left ureteral stent placement (6 Fr open ended), left retrograde pyelography with interpretation   Hospital Course:  Kristin Sawyer is a 40yo female with PMH obesity, tobacco abuse, and diverticulitis who presented to [facility] 4/28 with progressive fatigue, chills, anorexia, nausea, and loose stools. She was recently admitted at [facility] from 3/30-06/17/20 for diverticulitis with perforation that was managed with IV antibiotics. She was discharged from there on oral augmentin but symptoms never fully resolved. Symptoms worsened so she came to the ED. CT scan revealed diverticulosis with large phlegmon, no drainable fluid collection. Patient was admitted to the surgical service and started on IV antibiotics. She failed conservative management and was taken to the OR 07/17/20 for Exploratory laparotomy, segmental colectomy (sigmoid colon), abdominal washout, JP drain placement, colostomy creation. Urology assisted for Cystoscopy, left ureteral stent placement (6 Fr open ended), left retrograde pyelography with interpretation. Tolerated procedure well and was transferred to the floor.  Once bowel function returned diet was advanced as tolerated. With persistent  leukocytosis a CT scan was repeated on POD#5 and revealed Loculated fluid collection versus developing abscess in the upper pelvis. Imaging reviewed with interventional radiology and collection too small to allow for image guided aspiration or drain placement. Intraoperative cultures revealed abscess to be resistant to zosyn, therefore she was transitioned to cefepime on 5/8. Patient worked with our wound/ ostomy/ continence team as well as therapies during admission. On POD8, the patient was tolerating diet, colostomy functioning, ambulating well, pain well controlled, vital signs stable, wound stable, and she/ her family felt comfortable with wound and ostomy care. She was felt stable for discharge home.  She will go home with 2 weeks of rocephin. Patient will follow up as below and knows to call with questions or concerns.    I have personally reviewed the patients medication history on the [place] controlled substance database.    Physical Exam: Gen: Alert, NAD Pulm: rate and effort normal Abd: Soft,nontender,ostomy difficult to visualize through pouch/ liquid stool in pouch, open midline wound with cdi dressing in place (pt's mother coming later in the morning to practice dressing change with Kristin Sawyer), JP drain with serous fluid in bulb  Allergies as of 07/25/2020      Reactions   Other Other (See Comments)   Rondec DM  Reaction: causes hallucinations      Medication List    STOP taking these medications   meloxicam 15 MG tablet Commonly known as: MOBIC   metroNIDAZOLE 500 MG tablet Commonly known as: FLAGYL   ondansetron 4 MG tablet Commonly known as: Zofran   venlafaxine XR 37.5 MG 24 hr capsule Commonly known as: EFFEXOR-XR     TAKE these medications   acetaminophen 500 MG tablet Commonly known as: TYLENOL Take 2 tablets (1,000 mg total) by mouth every 6 (six) hours as needed for mild pain.   cefTRIAXone  IVPB Commonly known as: ROCEPHIN Inject 2 g into the vein daily  for 15 days. Indication:  Intra-abdominal infection First Dose: No Last Day of Therapy:  08/08/2020 Labs - Once weekly:  CBC/D and BMP, Labs - Every other week:  ESR and CRP Method of administration: IV Push Method of administration may be changed at the discretion of home infusion pharmacist based upon assessment of the patient and/or caregiver's ability to self-administer the medication ordered.   methocarbamol 500 MG tablet Commonly known as: ROBAXIN Take 2 tablets (1,000 mg total) by mouth every 6 (six) hours as needed for muscle spasms.   MULTIVITAMIN ADULT PO Take 1 tablet by mouth daily.   ondansetron 4 MG disintegrating tablet Commonly known as: ZOFRAN-ODT Take 1 tablet (4 mg total) by mouth every 6 (six) hours as needed for nausea.   Oxycodone HCl 10 MG Tabs Take 0.5-1 tablets (5-10 mg total) by mouth every 6 (six) hours as needed for moderate pain or severe pain.   PROBIOTIC PO Take 1 tablet by mouth daily.            Durable Medical Equipment  (From admission, onward)         Start     Ordered   07/21/20 1638  For home use only DME Walker rolling  Once       Comments: bariatric size 2/2 body habitus  Question Answer Comment  Walker: With 5 Inch Wheels   Patient needs a walker to treat with the following condition Status post Jeanette Caprice procedure [facility])   Patient needs a walker to treat with the following condition Diverticulitis of intestine with abscess      07/21/20 1637   07/21/20 1638  For home use only DME 3 n 1  Once       Comments: bariatric size 2/2 body habitus   07/21/20 1637           Discharge Care Instructions  (From admission, onward)         Start     Ordered   07/24/20 0000  Change dressing on IV access line weekly and PRN  (Home infusion instructions - [facility] )        07/24/20 [place] Follow up.   Why: [place]: Registered Nurse and  Physical THerapy- services to start on 08-01-20 Contact information: [place] [id number] [phone]        Jesusita Oka, MD. Go on 07/30/2020.   Specialty: Surgery Why: 5/18 at 1:30pm. Please bring a copy of your photo ID and insurance card. Please arrive 30 minutes prior to your photo ID and insurance card.  Contact information: [address] [place] [address] [phone]        Donella Stade, PA-C. Call.   Specialty: Family Medicine Why: Follow up with primary care physician Contact information: [place] [address] [phone]               Signed: Margie Billet, [facility] 07/24/2020, 2:50 PM Please see Amion for pager number during day hours 7:00am-4:30pm

## 2020-07-24 NOTE — Progress Notes (Signed)
Occupational Therapy Treatment and Discharge Patient Details Name: Kristin Sawyer MRN: [id number] DOB: 05-03-1980 Today's Date: 07/24/2020    History of present illness Pt admit 4/28 with Sigmoid diverticulitis with perforation and 8.5x6.7x7.8 cm phlegmon.and vac in place on abdomen.  5/5 for sigmoid colectomy and colostomy. PMH: obesity, smoker.   OT comments  Pt activity tolerance and strength have increased well to complete functional activities at baseline level. Pt has met all OT goals, reviewed education on safe functional mobility and energy conservation. OT and pt discussed DC to ensure that pt had no further concerns or questions regarding her daily routine. OT to sign off on pt and pt is agreeable. All questions, concerns, and needs met.     Follow Up Recommendations  No OT follow up    Equipment Recommendations  3 in 1 bedside commode (Bariatric size)    Recommendations for Other Services      Precautions / Restrictions Precautions Precautions: Fall Precaution Comments: JP drain Restrictions Weight Bearing Restrictions: No       Mobility Bed Mobility Overal bed mobility: Modified Independent Bed Mobility: Sit to Supine       Sit to supine: Modified independent (Device/Increase time);HOB elevated   General bed mobility comments: Pt used railings and HOB elevated    Transfers Overall transfer level: Modified independent   Transfers: Sit to/from Stand Sit to Stand: Modified independent (Device/Increase time)         General transfer comment: Increased time due to pain    Balance Overall balance assessment: Independent;No apparent balance deficits (not formally assessed) Sitting-balance support: No upper extremity supported;Feet supported Sitting balance-Leahy Scale: Normal     Standing balance support: During functional activity;No upper extremity supported Standing balance-Leahy Scale: Good                             ADL either  performed or assessed with clinical judgement   ADL Overall ADL's : Modified independent Eating/Feeding: Independent;Sitting   Grooming: Oral care;Wash/dry hands;Modified independent;Standing           Upper Body Dressing : Independent;Standing Upper Body Dressing Details (indicate cue type and reason): doffed back gown standing Lower Body Dressing: Independent;Sitting/lateral leans;Sit to/from stand Lower Body Dressing Details (indicate cue type and reason): donned socks and underwear Toilet Transfer: Modified Independent;Ambulation;Regular Glass blower/designer Details (indicate cue type and reason): completed in bathroom on handicap height toilet Toileting- Clothing Manipulation and Hygiene: Independent;Sitting/lateral lean;Sit to/from stand Toileting - Clothing Manipulation Details (indicate cue type and reason): completed in bathroom, able to pull underwear on and off after pericare     Functional mobility during ADLs: Modified independent General ADL Comments: Pt gait is slow, however she was able to safely ambulate in room with no DME     Vision       Perception     Praxis      Cognition Arousal/Alertness: Awake/alert Behavior During Therapy: WFL for tasks assessed/performed Overall Cognitive Status: Within Functional Limits for tasks assessed                                 General Comments: Pt overall less anxious this session, ready to go home        Exercises     Shoulder Instructions       General Comments VSS on RA, less anxiety this session    Pertinent Vitals/ Pain  Pain Assessment: 0-10 Pain Score: 6  Pain Location: abdomen Pain Descriptors / Indicators: Sore;Discomfort Pain Intervention(s): Limited activity within patient's tolerance;Monitored during session;Repositioned  Home Living                                          Prior Functioning/Environment              Frequency            Progress Toward Goals  OT Goals(current goals can now be found in the care plan section)  Progress towards OT goals: Goals met/education completed, patient discharged from OT  Acute Rehab OT Goals Patient Stated Goal: to go home OT Goal Formulation: All assessment and education complete, DC therapy Time For Goal Achievement: 08/04/20 Potential to Achieve Goals: Good ADL Goals Pt Will Perform Grooming: Independently;standing Pt Will Perform Lower Body Bathing: with modified independence;sitting/lateral leans;sit to/from stand Pt Will Perform Lower Body Dressing: with modified independence;sitting/lateral leans;sit to/from stand Pt Will Transfer to Toilet: Independently;ambulating;regular height toilet  Plan All goals met and education completed, patient discharged from OT services    Co-evaluation                 AM-PAC OT "6 Clicks" Daily Activity     Outcome Measure   Help from another person eating meals?: None Help from another person taking care of personal grooming?: None Help from another person toileting, which includes using toliet, bedpan, or urinal?: None Help from another person bathing (including washing, rinsing, drying)?: None Help from another person to put on and taking off regular upper body clothing?: None Help from another person to put on and taking off regular lower body clothing?: None 6 Click Score: 24    End of Session    OT Visit Diagnosis: Muscle weakness (generalized) (M62.81)   Activity Tolerance Patient tolerated treatment well   Patient Left in bed;with call bell/phone within reach   Nurse Communication Mobility status        Time: 6948-5462 OT Time Calculation (min): 14 min  Charges: OT General Charges $OT Visit: 1 Visit OT Treatments $Self Care/Home Management : 8-22 mins  Kristin Luth H., OTR/L Acute Rehabilitation  Kristin Sawyer 07/24/2020, 2:35 PM

## 2020-07-24 NOTE — TOC Progression Note (Signed)
Transition of Care [facility]) - Progression Note    Patient Details  Name: Kristin Sawyer MRN: [id number] Date of Birth: November 20, 1980  [facility] CM/SW Contact  Graves-Bigelow, Ocie Cornfield, RN Phone Number: 07/24/2020, 10:33 AM  Clinical Narrative: Case Manager reached out to [place] and IV antibiotics should be mixed today. Education with the patient and her mother will take place on 07-25-20. [employer] will be the company for home health RN for PICC line care and Labs. Mom will be responsible for wound and ostomy care until [facility] is in the home on next Friday. Case Manager will continue to follow for additional transition of care needs.   Expected Discharge Plan: [place]    Expected Discharge Plan and Services Expected Discharge Plan: [place]   Discharge Planning Services: CM Consult Post Acute Care Choice: Home Health,Durable Medical Equipment Living arrangements for the past 2 months: Single Family Home                 DME Arranged: 3-N-1,Walker rolling DME Agency: [employer]       HH Arranged: RN,PT,OT     Readmission Risk Interventions No flowsheet data found.

## 2020-07-24 NOTE — Discharge Instructions (Signed)
[facility], [place] [phone]  OPEN ABDOMINAL SURGERY: POST OP INSTRUCTIONS  Always review your discharge instruction sheet given to you by the facility where your surgery was performed.  IF YOU HAVE DISABILITY OR FAMILY LEAVE FORMS, YOU MUST BRING THEM TO THE OFFICE FOR PROCESSING.  PLEASE DO NOT GIVE THEM TO YOUR DOCTOR.  1. A prescription for pain medication may be given to you upon discharge.  Take your pain medication as prescribed, if needed.  If narcotic pain medicine is not needed, then you may take acetaminophen (Tylenol) or ibuprofen (Advil) as needed. 2. Take your usually prescribed medications unless otherwise directed. 3. If you need a refill on your pain medication, please contact your pharmacy. They will contact our office to request authorization.  Prescriptions will not be filled after 5pm or on week-ends. 4. You should follow a light diet the first few days after arrival home, such as soup and crackers, pudding, etc.unless your doctor has advised otherwise. A high-fiber, low fat diet can be resumed as tolerated.   Be sure to include lots of fluids daily. Most patients will experience some swelling and bruising on the chest and neck area.  Ice packs will help.  Swelling and bruising can take several days to resolve 5. Most patients will experience some swelling and bruising in the area of the incision. Ice pack will help. Swelling and bruising can take several days to resolve..  6. It is common to experience some constipation if taking pain medication after surgery.  Increasing fluid intake and taking a stool softener will usually help or prevent this problem from occurring.  A mild laxative (Milk of Magnesia or Miralax) should be taken according to package directions if there are no bowel movements after 48 hours. 7. ACTIVITIES:  You may resume regular (light) daily activities beginning the next day--such as daily self-care, walking, climbing stairs--gradually  increasing activities as tolerated.  You may have sexual intercourse when it is comfortable.  Refrain from any heavy lifting or straining until approved by your doctor. a. You may drive when you no longer are taking prescription pain medication, you can comfortably wear a seatbelt, and you can safely maneuver your car and apply brakes 8. You should see your doctor in the office for a follow-up appointment approximately two weeks after your surgery.  Make sure that you call for this appointment within a day or two after you arrive home to insure a convenient appointment time.  WHEN TO CALL YOUR DOCTOR: 1. Fever over 101.0 2. Inability to urinate 3. Nausea and/or vomiting 4. Extreme swelling or bruising 5. Continued bleeding from incision. 6. Increased pain, redness, or drainage from the incision. 7. Difficulty swallowing or breathing 8. Muscle cramping or spasms. 9. Numbness or tingling in hands or feet or around lips.  The clinic staff is available to answer your questions during regular business hours.  Please don't hesitate to call and ask to speak to one of the nurses if you have concerns.  For further questions, please visit [email or web address]     Colostomy Home Guide, Adult  Colostomy surgery is done to create an opening in the front of the abdomen for stool (feces) to leave the body through an ostomy (stoma). Part of the large intestine is attached to the stoma. A bag, also called a pouch, is fitted over the stoma. Stool and gas will collect in the bag. After surgery, you will need to empty and change your colostomy  bag as needed. You will also need to care for your stoma. How to care for the stoma Your stoma should look pink, red, and moist, like the inside of your cheek. Soon after surgery, the stoma may be swollen, but this swelling will go away within 6 weeks. To care for the stoma:  Keep the skin around the stoma clean and dry.  Use a clean, soft washcloth to  gently wash the stoma and the skin around it. Clean using a circular motion, and wipe away from the stoma opening, not toward it. ? Use warm water and only use cleansers recommended by your health care provider. ? Rinse the stoma area with plain water. ? Dry the area around the stoma well.  Use stoma powder or ointment on your skin only as told by your health care provider. Do not use any other powders, gels, wipes, or creams on the skin around the stoma.  Check the stoma area every day for signs of infection. Check for: ? New or worsening redness, swelling, or pain. ? New or increased fluid or blood. ? Pus or warmth.  Measure the stoma opening regularly and record the size. Watch for changes. (It is normal for the stoma to get smaller as swelling goes away.) Share this information with your health care provider. How to empty the colostomy bag Empty your bag at bedtime and whenever it is one-third to one-half full. Do not let the bag get more than half-full with stool or gas. The bag could leak if it gets too full. Some colostomy bags have a built-in gas release valve that releases gas often throughout the day. Follow these basic steps: 1. Wash your hands with soap and water. 2. Sit far back on the toilet seat. 3. Put several pieces of toilet paper into the toilet water. This will prevent splashing as you empty stool into the toilet. 4. Remove the clip or the hook-and-loop fastener from the tail end of the bag. 5. Unroll the tail, then empty the stool into the toilet. 6. Clean the tail with toilet paper or a moist towelette. 7. Reroll the tail, and close it with the clip or the hook-and-loop fastener. 8. Wash your hands again.   How to change the colostomy bag Change your bag every 3-4 days or as often as told by your health care provider. Also change the bag if it is leaking or separating from the skin, or if your skin around the stoma looks or feels irritated. Irritated skin may be a sign  that the bag is leaking. Always have colostomy supplies with you, and follow these basic steps: 1. Wash your hands with soap and water. Have paper towels or tissues nearby to clean any discharge. 2. Remove the old bag and skin barrier. Use your fingers or a warm cloth to gently push the skin away from the barrier. 3. Clean the stoma area with water or with mild soap and water, as directed. Use water to rinse away any soap. 4. Dry the skin. You may use the cool setting on a hair dryer to do this. 5. Use a tracing pattern (template) to cut the skin barrier to the size needed. 6. If you are using a two-piece bag, attach the bag and the skin barrier to each other. Add the barrier ring, if you use one. 7. If directed, apply stoma powder or skin barrier gel to the skin. 8. Warm the skin barrier with your hands, or blow with a hair dryer  for 5-10 seconds. 9. Remove the paper from the adhesive strip of the skin barrier. 10. Press the adhesive strip onto the skin around the stoma. 11. Gently rub the skin barrier onto the skin. This creates heat that helps the barrier to stick. 12. Apply stoma tape to the edges of the skin barrier, if desired. 76. Wash your hands again. General recommendations  Avoid wearing tight clothes or having anything press directly on your stoma or bag. Change your clothing whenever it is soiled or damp.  You may shower or bathe with the bag on or off. Do not use harsh or oily soaps or lotions. Dry the skin and bag after bathing.  Store all supplies in a cool, dry place. Do not leave supplies in extreme heat because some parts can melt or not stick as well.  Whenever you leave home, take extra clothing and an extra skin barrier and bag with you.  If your bag gets wet, you can dry it with a hair dryer on the cool setting.  To prevent odor, you may put drops of ostomy deodorizer in the bag.  If recommended by your health care provider, put ostomy lubricant inside the bag.  This helps stool to slide out of the bag more easily and completely. Contact a health care provider if:  You have new or worsening redness, swelling, or pain around your stoma.  You have new or increased fluid or blood coming from your stoma.  Your stoma feels warm to the touch.  You have pus coming from your stoma.  Your stoma extends in or out farther than normal.  You need to change your bag every day.  You have a fever. Get help right away if:  Your stool is bloody.  You have nausea or you vomit.  You have trouble breathing. Summary  Measure your stoma opening regularly and record the size. Watch for changes.  Empty your bag at bedtime and whenever it is one-third to one-half full. Do not let the bag get more than half-full with stool or gas.  Change your bag every 3-4 days or as often as told by your health care provider.  Whenever you leave home, take extra clothing and an extra skin barrier and bag with you. This information is not intended to replace advice given to you by your health care provider. Make sure you discuss any questions you have with your health care provider. Document Revised: 11/01/2019 Document Reviewed: 11/01/2019 Elsevier Patient Education  2021 [place] Surgical drains are used to remove extra fluid that normally builds up in a surgical wound after surgery. A surgical drain helps to heal a surgical wound. Different kinds of surgical drains include:  Active drains. These drains use suction to pull drainage away from the surgical wound. Drainage flows through a tube to a container outside of the body. With these drains, you need to keep the bulb or the drainage container flat (compressed) at all times, except while you empty it. Flattening the bulb or container creates suction.  Passive drains. These drains allow fluid to drain naturally, by gravity. Drainage flows through a tube to a bandage (dressing) or a  container outside of the body. Passive drains do not need to be emptied. A drain is placed during surgery. Right after surgery, drainage is usually bright red and a little thicker than water. The drainage may gradually turn yellow or pink and become thinner. It is likely that your  health care provider will remove the drain when the drainage stops or when the amount decreases to 1-2 Tbsp (15-30 mL) during a 24-hour period. Supplies needed:  Tape.  Germ-free cleaning solution (sterile saline).  Cotton swabs.  Split gauze drain sponge: 4 x 4 inches (10 x 10 cm).  Gauze square: 4 x 4 inches (10 x 10 cm). How to care for your surgical drain Care for your drain as told by your health care provider. This is important to help prevent infection. If your drain is placed at your back, or any other hard-to-reach area, ask another person to assist you in performing the following tasks: General care  Keep the skin around the drain dry and covered with a dressing at all times.  Check your drain area every day for signs of infection. Check for: ? Redness, swelling, or pain. ? Pus or a bad smell. ? Cloudy drainage. ? Tenderness or pressure at the drain exit site. Changing the dressing Follow instructions from your health care provider about how to change your dressing. Change your dressing at least once a day. Change it more often if needed to keep the dressing dry. Make sure you: 1. Gather your supplies. 2. Wash your hands with soap and water before you change your dressing. If soap and water are not available, use hand sanitizer. 3. Remove the old dressing. Avoid using scissors to do that. 4. Wash your hands with soap and water again after removing the old dressing. 5. Use sterile saline to clean your skin around the drain. You may need to use a cotton swab to clean the skin. 6. Place the tube through the slit in a drain sponge. Place the drain sponge so that it covers your wound. 7. Place the gauze  square or another drain sponge on top of the drain sponge that is on the wound. Make sure the tube is between those layers. 8. Tape the dressing to your skin. 9. Tape the drainage tube to your skin 1-2 inches (2.5-5 cm) below the place where the tube enters your body. Taping keeps the tube from pulling on any stitches (sutures) that you have. 10. Wash your hands with soap and water. 11. Write down the color of your drainage and how often you change your dressing. How to empty your active drain 1. Make sure that you have a measuring cup that you can empty your drainage into. 2. Wash your hands with soap and water. If soap and water are not available, use hand sanitizer. 3. Loosen any pins or clips that hold the tube in place. 4. If your health care provider tells you to strip the tube to prevent clots and tube blockages: ? Hold the tube at the skin with one hand. Use your other hand to pinch the tubing with your thumb and first finger. ? Gently move your fingers down the tube while squeezing very lightly. This clears any drainage, clots, or tissue from the tube. ? You may need to do this several times each day to keep the tube clear. Do not pull on the tube. 5. Open the bulb cap or the drain plug. Do not touch the inside of the cap or the bottom of the plug. 6. Turn the device upside down and gently squeeze. 7. Empty all of the drainage into the measuring cup. 8. Compress the bulb or the container and replace the cap or the plug. To compress the bulb or the container, squeeze it firmly in the middle while  you close the cap or plug the container. 9. Write down the amount of drainage that you have in each 24-hour period. If you have less than 2 Tbsp (30 mL) of drainage during 24 hours, contact your health care provider. 10. Flush the drainage down the toilet. 11. Wash your hands with soap and water.   Contact a health care provider if:  You have redness, swelling, or pain around your drain  area.  You have pus or a bad smell coming from your drain area.  You have a fever or chills.  The skin around your drain is warm to the touch.  The amount of drainage that you have is increasing instead of decreasing.  You have drainage that is cloudy.  There is a sudden stop or a sudden decrease in the amount of drainage that you have.  Your drain tube falls out.  Your active drain does not stay compressed after you empty it. Summary  Surgical drains are used to remove extra fluid that normally builds up in a surgical wound after surgery.  Different kinds of surgical drains include active drains and passive drains. Active drains use suction to pull drainage away from the surgical wound, and passive drains allow fluid to drain naturally.  It is important to care for your drain to prevent infection. If your drain is placed at your back, or any other hard-to-reach area, ask another person to assist you.  Contact your health care provider if you have redness, swelling, or pain around your drain area. This information is not intended to replace advice given to you by your health care provider. Make sure you discuss any questions you have with your health care provider. Document Revised: 04/05/2018 Document Reviewed: 04/05/2018 Elsevier Patient Education  [facility] [facility] WOUND CARE: - Change dressing twice daily - Supplies: sterile saline, kerlex, scissors, ABD pads, tape  1. Remove dressing and all packing carefully, moistening with sterile saline as needed to avoid packing/internal dressing sticking to the wound. 2.   Clean edges of skin around the wound with water/gauze, making sure there is no tape debris or leakage left on skin that could cause skin irritation or breakdown. 3.   Dampen and clean kerlex with sterile saline and pack wound from wound base to skin level, making sure to take note of any possible areas of wound tracking, tunneling and packing  appropriately. Wound can be packed loosely. Trim kerlex to size if a whole kerlex is not required. 4.   Cover wound with a dry ABD pad and secure with tape.  5.   Write the date/time on the dry dressing/tape to better track when the last dressing change occurred. - apply any skin protectant/powder if recommended by clinician to protect skin/skin folds. - change dressing as needed if leakage occurs, wound gets contaminated, or patient requests to shower. - You may shower daily with wound open and following the shower the wound should be dried and a clean dressing placed.  - Medical grade tape as well as packing supplies can be found at [employer] on [address] or [employer] on [address]. The remaining supplies can be found at your local drug store, [place] etc.

## 2020-07-25 ENCOUNTER — Encounter: Payer: Self-pay | Admitting: Physician Assistant

## 2020-07-25 ENCOUNTER — Telehealth (INDEPENDENT_AMBULATORY_CARE_PROVIDER_SITE_OTHER): Payer: No Typology Code available for payment source | Admitting: Physician Assistant

## 2020-07-25 VITALS — BP 122/52 | HR 76 | Temp 98.1°F | Ht 63.0 in | Wt 293.0 lb

## 2020-07-25 DIAGNOSIS — F430 Acute stress reaction: Secondary | ICD-10-CM | POA: Insufficient documentation

## 2020-07-25 DIAGNOSIS — K630 Abscess of intestine: Secondary | ICD-10-CM | POA: Insufficient documentation

## 2020-07-25 DIAGNOSIS — F418 Other specified anxiety disorders: Secondary | ICD-10-CM | POA: Insufficient documentation

## 2020-07-25 DIAGNOSIS — K5792 Diverticulitis of intestine, part unspecified, without perforation or abscess without bleeding: Secondary | ICD-10-CM | POA: Insufficient documentation

## 2020-07-25 DIAGNOSIS — R4583 Excessive crying of child, adolescent or adult: Secondary | ICD-10-CM | POA: Diagnosis not present

## 2020-07-25 DIAGNOSIS — K5721 Diverticulitis of large intestine with perforation and abscess with bleeding: Secondary | ICD-10-CM

## 2020-07-25 DIAGNOSIS — Z9049 Acquired absence of other specified parts of digestive tract: Secondary | ICD-10-CM

## 2020-07-25 MED ORDER — OXYCODONE HCL 10 MG PO TABS: 5.0000 mg | ORAL_TABLET | Freq: Four times a day (QID) | ORAL | 0 refills | Status: DC | PRN

## 2020-07-25 MED ORDER — ONDANSETRON 4 MG PO TBDP: 4.0000 mg | ORAL_TABLET | Freq: Four times a day (QID) | ORAL | 0 refills | Status: DC | PRN

## 2020-07-25 MED ORDER — CLONAZEPAM 0.5 MG PO TABS: 0.5000 mg | ORAL_TABLET | Freq: Two times a day (BID) | ORAL | 0 refills | Status: DC | PRN

## 2020-07-25 MED ORDER — FLUCONAZOLE 150 MG PO TABS: 150.0000 mg | ORAL_TABLET | Freq: Once | ORAL | 0 refills | Status: AC

## 2020-07-25 MED ORDER — BUSPIRONE HCL 10 MG PO TABS: 10.0000 mg | ORAL_TABLET | Freq: Three times a day (TID) | ORAL | 0 refills | Status: DC

## 2020-07-25 MED ORDER — METHOCARBAMOL 500 MG PO TABS: 1000.0000 mg | ORAL_TABLET | Freq: Four times a day (QID) | ORAL | 0 refills | Status: DC | PRN

## 2020-07-25 MED ORDER — SERTRALINE HCL 50 MG PO TABS: 50.0000 mg | ORAL_TABLET | Freq: Every day | ORAL | 1 refills | Status: DC

## 2020-07-25 NOTE — Consult Note (Signed)
[place] Nurse ostomy follow up Patient receiving care in [facility] 6N26 Stoma type/location: LUQ colostomy Stomal assessment/size: Retracted stoma the is red and irritated Peristomal assessment: Red irritation very painful  Treatment options for stomal/peristomal skin:  Instruction for "Crusting" with Stoma Powder and Skin Barrier Wipes: 1. After cleaning around the stoma WITH WATER ONLY, sprinkle Stoma Powder on the irritated skin. 2. Spread the powder around with your finger.  The powder will adhere ONLY to wet, raw skin.  Brush any loose powder off. 3. DAB the powder that stuck to the irritated skin with a skin barrier wipe.  Do NOT rub, only dab the powder to moisten it. 4. Allow the powder to air dry.  You can repeat the process of spreading powder and dabbing up to three times, allowing each process to air dry. 5. Place the prepared pouching system around the stoma. Output: green/brown thin Ostomy pouching: 1pc. convex Education provided: Mother was present today for the pouch change and was able to complete the entire process along with crusting in the peristomal area. Mother is a NP and feels very comfortable with the process. Patient to be discharged today. Enrolled patient in [place] Start Discharge program: Yes  Cathlean Marseilles. Tamala Julian, MSN, RN, [place], Lysle Pearl, [facility] Wound Treatment Associate Pager [phone]

## 2020-07-25 NOTE — Addendum Note (Signed)
Addended byDonella Stade on: 07/25/2020 12:12 PM   Modules accepted: Orders

## 2020-07-25 NOTE — Plan of Care (Signed)

## 2020-07-25 NOTE — Progress Notes (Signed)
Patient had abdominal surgery, currently admitted at [facility] She is supposed to be discharged today She has been placed on Buspar and Wellbutrin while in the hospital but has been told they would not send her home on medication that she would have to see primary care to manage She has been having bad dreams, very tearful No psych evaluation in the hospital [id number] completed.

## 2020-07-25 NOTE — Addendum Note (Signed)
Addended by: Donella Stade on: 07/25/2020 01:09 PM   Modules accepted: Orders

## 2020-07-25 NOTE — Progress Notes (Signed)
Kristin Sawyer to be D/C'd  per MD order. Discussed with the patient and all questions fully answered.  VSS, Skin clean, dry and intact without evidence of skin break down, no evidence of skin tears noted.  IV catheter discontinued intact. Site without signs and symptoms of complications. Dressing and pressure applied.  An After Visit Summary was printed and given to the patient. Patient received prescription.  D/c education completed with patient/family including follow up instructions, medication list, d/c activities limitations if indicated, with other d/c instructions as indicated by MD - patient able to verbalize understanding, all questions fully answered.   Patient instructed to return to ED, call 911, or call MD for any changes in condition.   Patient to be escorted via [place], and D/C home via private auto.

## 2020-07-25 NOTE — Progress Notes (Signed)
PT Cancellation Note  Patient Details Name: Kristin Sawyer MRN: [id number] DOB: March 10, 1981   Cancelled Treatment:    Reason Eval/Treat Not Completed: Other (comment).  Declining PT and will retry tomorrow if pt is not discharged as planned today.   Ramond Dial 07/25/2020, 11:24 AM Mee Hives, PT MS Acute Rehab Dept. Number: [place]

## 2020-07-25 NOTE — Plan of Care (Signed)

## 2020-07-25 NOTE — TOC Progression Note (Addendum)
Transition of Care [facility]) - Progression Note    Patient Details  Name: Kristin Sawyer MRN: [id number] Date of Birth: 02/28/1981  [facility]) CM/SW Contact  Jacalyn Lefevre Edson Snowball, RN Phone Number: 07/25/2020, 11:20 AM  Clinical Narrative:     Jeannene Patella with [facility] aware discharge today and has provided education and spoken to bedside nurse.   Linda with [facility] aware of discharge ( see prior TOC note). They will start care next Friday.   Updated orders and discharge summary faxed to [facility]   Patient has received DME already walker and 3 in 1 , family has taken home.   Expected Discharge Plan: [place]    Expected Discharge Plan and Services Expected Discharge Plan: [place]   Discharge Planning Services: CM Consult Post Acute Care Choice: Home Health,Durable Medical Equipment Living arrangements for the past 2 months: Single Family Home Expected Discharge Date: 07/25/20               DME Arranged: 3-N-1,Walker rolling DME Agency: [employer]       HH Arranged: RN,PT,OT           Social Determinants of Health (SDOH) Interventions    Readmission Risk Interventions No flowsheet data found.

## 2020-07-25 NOTE — Plan of Care (Signed)
  Problem: Education: Goal: Knowledge of General Education information will improve Description: Including pain rating scale, medication(s)/side effects and non-pharmacologic comfort measures 07/25/2020 1102 by Camillia Herter, RN Outcome: Adequate for Discharge 07/25/2020 1034 by Camillia Herter, RN Outcome: Progressing   Problem: Health Behavior/Discharge Planning: Goal: Ability to manage health-related needs will improve 07/25/2020 1102 by Camillia Herter, RN Outcome: Adequate for Discharge 07/25/2020 1034 by Camillia Herter, RN Outcome: Progressing   Problem: Clinical Measurements: Goal: Ability to maintain clinical measurements within normal limits will improve 07/25/2020 1102 by Camillia Herter, RN Outcome: Adequate for Discharge 07/25/2020 1034 by Camillia Herter, RN Outcome: Progressing Goal: Will remain free from infection 07/25/2020 1102 by Camillia Herter, RN Outcome: Adequate for Discharge 07/25/2020 1034 by Camillia Herter, RN Outcome: Progressing Goal: Diagnostic test results will improve 07/25/2020 1102 by Camillia Herter, RN Outcome: Adequate for Discharge 07/25/2020 1034 by Camillia Herter, RN Outcome: Progressing Goal: Respiratory complications will improve 07/25/2020 1102 by Camillia Herter, RN Outcome: Adequate for Discharge 07/25/2020 1034 by Julius Bowels A, RN Outcome: Progressing Goal: Cardiovascular complication will be avoided 07/25/2020 1102 by Camillia Herter, RN Outcome: Adequate for Discharge 07/25/2020 1034 by Camillia Herter, RN Outcome: Progressing   Problem: Activity: Goal: Risk for activity intolerance will decrease 07/25/2020 1102 by Camillia Herter, RN Outcome: Adequate for Discharge 07/25/2020 1034 by Camillia Herter, RN Outcome: Progressing   Problem: Nutrition: Goal: Adequate nutrition will be maintained 07/25/2020 1102 by Camillia Herter, RN Outcome: Adequate for Discharge 07/25/2020 1034 by Camillia Herter, RN Outcome:  Progressing   Problem: Coping: Goal: Level of anxiety will decrease 07/25/2020 1102 by Camillia Herter, RN Outcome: Adequate for Discharge 07/25/2020 1034 by Camillia Herter, RN Outcome: Progressing   Problem: Elimination: Goal: Will not experience complications related to bowel motility 07/25/2020 1102 by Camillia Herter, RN Outcome: Adequate for Discharge 07/25/2020 1034 by Camillia Herter, RN Outcome: Progressing Goal: Will not experience complications related to urinary retention 07/25/2020 1102 by Camillia Herter, RN Outcome: Adequate for Discharge 07/25/2020 1034 by Camillia Herter, RN Outcome: Progressing   Problem: Pain Managment: Goal: General experience of comfort will improve 07/25/2020 1102 by Camillia Herter, RN Outcome: Adequate for Discharge 07/25/2020 1034 by Camillia Herter, RN Outcome: Progressing   Problem: Safety: Goal: Ability to remain free from injury will improve 07/25/2020 1102 by Camillia Herter, RN Outcome: Adequate for Discharge 07/25/2020 1034 by Camillia Herter, RN Outcome: Progressing   Problem: Skin Integrity: Goal: Risk for impaired skin integrity will decrease 07/25/2020 1102 by Camillia Herter, RN Outcome: Adequate for Discharge 07/25/2020 1034 by Camillia Herter, RN Outcome: Progressing   Problem: Education: Goal: Required Educational Video(s) 07/25/2020 1102 by Camillia Herter, RN Outcome: Adequate for Discharge 07/25/2020 1034 by Camillia Herter, RN Outcome: Progressing   Problem: Clinical Measurements: Goal: Ability to maintain clinical measurements within normal limits will improve 07/25/2020 1102 by Camillia Herter, RN Outcome: Adequate for Discharge 07/25/2020 1034 by Camillia Herter, RN Outcome: Progressing Goal: Postoperative complications will be avoided or minimized 07/25/2020 1102 by Camillia Herter, RN Outcome: Adequate for Discharge 07/25/2020 1034 by Camillia Herter, RN Outcome: Progressing   Problem: Skin  Integrity: Goal: Demonstration of wound healing without infection will improve 07/25/2020 1102 by Camillia Herter, RN Outcome: Adequate for Discharge 07/25/2020 1034 by Camillia Herter, RN Outcome: Progressing

## 2020-07-25 NOTE — Addendum Note (Signed)
Addended byAnnamaria Helling on: 07/25/2020 02:15 PM   Modules accepted: Orders

## 2020-07-25 NOTE — Progress Notes (Signed)
Patient ID: Kristin Sawyer, female   DOB: 1980/03/28, 40 y.o.   MRN: [id number] .Marland KitchenVirtual Visit via Video Note  I connected with Shatara Stanek on 07/25/20 at 10:10 AM EDT by a video enabled telemedicine application and verified that I am speaking with the correct person using two identifiers.  Location: Patient: hospital Provider: clinic  .Marland KitchenParticipating in visit:  Patient: Shera Provider: Iran Planas PA-C Provider in training: Edwina Barth  PA-student   I discussed the limitations of evaluation and management by telemedicine and the availability of in person appointments. The patient expressed understanding and agreed to proceed.  History of Present Illness: Patient is a 40 year old obese female who is actively in the hospital being discharged today after on March 30 she had diverticulitis with perforation and was admitted to [facility].  She had IV antibiotics and was discharged.  Her abdominal pain continued and on April 28 she presented to calm.  She was admitted again.  She has undergone IV antibiotics and had developed an abscess.  On 07/17/2020 she had a partial colectomy.  For the last months she has just been extremely tearful.  She is concerned about her health.  She has a 90-year-old son at home whom she worries about.  She feels like she cannot take care of herself and her son.  She just cannot stop crying.  She has now started to have trouble sleeping.  She would like help.  They are giving her BuSpar and Wellbutrin in the hospital per patient.  They are saying they will not discharge her on this medicine and she will need to follow-up with the PCP.   .. Active Ambulatory Problems    Diagnosis Date Noted  . Active labor 06/29/2014  . Spontaneous vaginal delivery 06/29/2014  . Low iron stores 06/15/2016  . Pernicious anemia 06/15/2016  . Vitamin D deficiency 06/15/2016  . Malnutrition of mild degree ([place]) 06/15/2016  . No energy 06/15/2016  . Depression,  recurrent ([place]) 06/15/2016  . Low back pain 06/15/2016  . Morbid obesity ([place]) 06/15/2016  . LGSIL on Pap smear of cervix 12/30/2016  . New daily persistent headache 05/31/2017  . Status migrainosus 05/31/2017  . Diverticulitis of intestine with abscess 07/10/2020  . Diverticulitis 07/25/2020  . Abscess, intestinal 07/25/2020  . S/P partial colectomy 07/25/2020  . Acute stress reaction 07/25/2020  . Excessive crying 07/25/2020  . Anxiety about health 07/25/2020   Resolved Ambulatory Problems    Diagnosis Date Noted  . No Resolved Ambulatory Problems   Past Medical History:  Diagnosis Date  . B12 nutritional deficiency   . Condyloma   . History of marijuana use   . History of trichomoniasis 2015  . HPV in female   . Hx of abnormal cervical Papanicolaou smear   . Hx of adenoidectomy   . Hx of tonsillectomy   . Ovarian cyst          Observations/Objective: Excessively crying laying in hospital bed.   .. Today's Vitals   07/25/20 1009  BP: (!) 122/52  Pulse: 76  Temp: 98.1 F (36.7 C)  TempSrc: Oral  Weight: 293 lb (132.9 kg)  Height: 5\' 3"  (1.6 m)   Body mass index is 51.9 kg/m.   .. Depression screen [facility] 2/9 07/25/2020 06/14/2016  Decreased Interest 3 2  Down, Depressed, Hopeless 1 1  PHQ - 2 Score 4 3  Altered sleeping 3 1  Tired, decreased energy 1 3  Change in appetite 3 3  Feeling bad or  failure about yourself  3 1  Trouble concentrating 3 0  Moving slowly or fidgety/restless 0 0  Suicidal thoughts 1 0  PHQ-9 Score 18 11  Difficult doing work/chores Very difficult -   .. GAD 7 : Generalized Anxiety Score 07/25/2020  Nervous, Anxious, on Edge 3  Control/stop worrying 3  Worry too much - different things 3  Trouble relaxing 3  Restless 0  Easily annoyed or irritable 0  Afraid - awful might happen 3  Total GAD 7 Score 15  Anxiety Difficulty Very difficult      Assessment and Plan: Marland KitchenMarland KitchenDevanie was seen today for depression, anxiety and  establish care.  Diagnoses and all orders for this visit:  Acute stress reaction -     busPIRone (BUSPAR) 10 MG tablet; Take 1 tablet (10 mg total) by mouth 3 (three) times daily. -     sertraline (ZOLOFT) 50 MG tablet; Take 1 tablet (50 mg total) by mouth daily. -     clonazePAM (KLONOPIN) 0.5 MG tablet; Take 1 tablet (0.5 mg total) by mouth 2 (two) times daily as needed for anxiety.  Excessive crying -     busPIRone (BUSPAR) 10 MG tablet; Take 1 tablet (10 mg total) by mouth 3 (three) times daily. -     sertraline (ZOLOFT) 50 MG tablet; Take 1 tablet (50 mg total) by mouth daily. -     clonazePAM (KLONOPIN) 0.5 MG tablet; Take 1 tablet (0.5 mg total) by mouth 2 (two) times daily as needed for anxiety.  Diverticulitis of large intestine with perforation with bleeding  Abscess, intestinal  S/P partial colectomy  Anxiety about health -     busPIRone (BUSPAR) 10 MG tablet; Take 1 tablet (10 mg total) by mouth 3 (three) times daily. -     sertraline (ZOLOFT) 50 MG tablet; Take 1 tablet (50 mg total) by mouth daily. -     clonazePAM (KLONOPIN) 0.5 MG tablet; Take 1 tablet (0.5 mg total) by mouth 2 (two) times daily as needed for anxiety.   Patient's PHQ and GAD numbers are very out of control.  Patient appears via video excessively crying.  I believe that she is just overwhelmed by her health condition and feeling helpless.  She says she has been given Wellbutrin and BuSpar in the hospital.  I do not see evidence of Wellbutrin.  I do see BuSpar.  I went ahead and sent in refill of BuSpar.  I added Zoloft.  Discussed this is a very big event in her life.  I do encourage her to start counseling.  She is going to reach out to her work.  She does have support and is moving in with her mother to help take care of her.  She will be discharged on IV antibiotics and home health wound care for her partial colectomy due to diverticulitis with perforation and abscess.  I did give her a few clonazepam to  use as needed for acute anxiety.  I discussed with her this is a controlled substance and to use sparingly.  Discussed I do not want her to be using multiple times a day.  Follow-up in 2 weeks.    Follow Up Instructions:    I discussed the assessment and treatment plan with the patient. The patient was provided an opportunity to ask questions and all were answered. The patient agreed with the plan and demonstrated an understanding of the instructions.   The patient was advised to call back or seek an  in-person evaluation if the symptoms worsen or if the condition fails to improve as anticipated.   Iran Planas, PA-C

## 2020-07-29 ENCOUNTER — Telehealth: Payer: Self-pay | Admitting: Neurology

## 2020-07-29 MED ORDER — TRAZODONE HCL 50 MG PO TABS: 25.0000 mg | ORAL_TABLET | Freq: Every evening | ORAL | 0 refills | Status: DC | PRN

## 2020-07-29 NOTE — Telephone Encounter (Signed)
Patient left vm.  She is taking her Zoloft, Buspar, and Clonazepam.  She has added in Melatonin OTC, still not sleeping. She will sleep 2 hours and then be up off and on all night. She has tried staying up all day, taking a nap during the day, it doesn't seem to matter, she is only sleeping for short amounts of time. Any other advise? Does she need another visit?

## 2020-07-29 NOTE — Telephone Encounter (Signed)
Added trazodone at bedtime for sleep. Make follow up appt next week.

## 2020-07-30 NOTE — Telephone Encounter (Signed)
Patient made aware. She was transferred to the front to schedule an appt for next week.

## 2020-08-04 ENCOUNTER — Telehealth (INDEPENDENT_AMBULATORY_CARE_PROVIDER_SITE_OTHER): Payer: No Typology Code available for payment source | Admitting: Physician Assistant

## 2020-08-04 ENCOUNTER — Encounter: Payer: Self-pay | Admitting: Physician Assistant

## 2020-08-04 DIAGNOSIS — F430 Acute stress reaction: Secondary | ICD-10-CM | POA: Diagnosis not present

## 2020-08-04 DIAGNOSIS — Z9049 Acquired absence of other specified parts of digestive tract: Secondary | ICD-10-CM

## 2020-08-04 DIAGNOSIS — R4589 Other symptoms and signs involving emotional state: Secondary | ICD-10-CM

## 2020-08-04 DIAGNOSIS — G479 Sleep disorder, unspecified: Secondary | ICD-10-CM

## 2020-08-04 DIAGNOSIS — F418 Other specified anxiety disorders: Secondary | ICD-10-CM

## 2020-08-04 DIAGNOSIS — K5720 Diverticulitis of large intestine with perforation and abscess without bleeding: Secondary | ICD-10-CM | POA: Diagnosis not present

## 2020-08-04 DIAGNOSIS — R4583 Excessive crying of child, adolescent or adult: Secondary | ICD-10-CM

## 2020-08-04 MED ORDER — TRAZODONE HCL 50 MG PO TABS: ORAL_TABLET | ORAL | 1 refills | Status: DC

## 2020-08-04 NOTE — Progress Notes (Signed)
Patient ID: Kristin Sawyer, female   DOB: 24-Dec-1980, 40 y.o.   MRN: [id number] .Marland KitchenVirtual Visit via Telephone Note  I connected with Kristin Sawyer on 08/04/20 at 10:50 AM EDT by telephone and verified that I am speaking with the correct person using two identifiers.  Location: Patient: home Provider: clinic  .Marland KitchenParticipating in visit:  Patient: Kristin Sawyer Provider: Iran Planas PA-C   I discussed the limitations, risks, security and privacy concerns of performing an evaluation and management service by telephone and the availability of in person appointments. I also discussed with the patient that there may be a patient responsible charge related to this service. The patient expressed understanding and agreed to proceed.   History of Present Illness: Pt is a 40 yo female who is 2 weeks hospital discharge for diverticulitis with abscess and status post colectomy with an acute stress reaction of anxiety and excessive crying.  She was started on Zoloft, BuSpar, trazodone.  She is doing much better.  She finds herself not crying nearly as much.  She feels like she is much more stable and sleeping at night.  She has moved in with her mother and father and son and they are helping to take care of her.  She has home health coming regularly for IV antibiotic monitoring and wound care.  She will see the surgeon later this week for postsurgical follow-up.  Overall she is doing much better.  .. Active Ambulatory Problems    Diagnosis Date Noted  . Active labor 06/29/2014  . Spontaneous vaginal delivery 06/29/2014  . Low iron stores 06/15/2016  . Pernicious anemia 06/15/2016  . Vitamin D deficiency 06/15/2016  . Malnutrition of mild degree ([place]) 06/15/2016  . No energy 06/15/2016  . Depression, recurrent ([place]) 06/15/2016  . Low back pain 06/15/2016  . Morbid obesity ([place]) 06/15/2016  . LGSIL on Pap smear of cervix 12/30/2016  . New daily persistent headache 05/31/2017  . Status migrainosus  05/31/2017  . Diverticulitis of intestine with abscess 07/10/2020  . Diverticulitis 07/25/2020  . Abscess, intestinal 07/25/2020  . S/P partial colectomy 07/25/2020  . Acute stress reaction 07/25/2020  . Excessive crying 07/25/2020  . Anxiety about health 07/25/2020   Resolved Ambulatory Problems    Diagnosis Date Noted  . No Resolved Ambulatory Problems   Past Medical History:  Diagnosis Date  . B12 nutritional deficiency   . Condyloma   . History of marijuana use   . History of trichomoniasis 2015  . HPV in female   . Hx of abnormal cervical Papanicolaou smear   . Hx of adenoidectomy   . Hx of tonsillectomy   . Ovarian cyst       Observations/Objective: No acute distress Normal mood.   Marland Kitchen.There were no vitals filed for this visit. There is no height or weight on file to calculate BMI.     Assessment and Plan: Marland KitchenMarland KitchenMomina was seen today for follow-up.  Diagnoses and all orders for this visit:  Acute stress reaction  Diverticulitis of large intestine with abscess, unspecified bleeding status  S/P partial colectomy  Excessive crying  Anxiety about health  Trouble in sleeping -     traZODone (DESYREL) 50 MG tablet; Take one to two tablets as needed for sleep.   Pt seems to be doing better. She is not crying as much and she is now sleeping.  2 weeks from acute stress trigger.  Continue on trazodone, buspar, zoloft.  Home health is managing her post surgical follow up from diverticulitis  with abscess and partial colectomy.  Follow up this week with surgeon.  Follow up in 6 weeks for mood.     Follow Up Instructions:    I discussed the assessment and treatment plan with the patient. The patient was provided an opportunity to ask questions and all were answered. The patient agreed with the plan and demonstrated an understanding of the instructions.   The patient was advised to call back or seek an in-person evaluation if the symptoms worsen or if the  condition fails to improve as anticipated.  I provided 10 minutes of non-face-to-face time during this encounter.   Iran Planas, PA-C

## 2020-08-04 NOTE — Progress Notes (Signed)
Trazodone is helping with sleep - took a couple nights but last three nights have been good  Home health is coming today to check vitals/draw labs

## 2020-08-06 ENCOUNTER — Other Ambulatory Visit: Payer: Self-pay | Admitting: Surgery

## 2020-08-06 DIAGNOSIS — K5792 Diverticulitis of intestine, part unspecified, without perforation or abscess without bleeding: Secondary | ICD-10-CM

## 2020-08-08 ENCOUNTER — Ambulatory Visit
Admission: RE | Admit: 2020-08-08 | Discharge: 2020-08-08 | Disposition: A | Discharge status: Home / Self Care | Payer: No Typology Code available for payment source | Source: Ambulatory Visit | Attending: Surgery | Admitting: Surgery

## 2020-08-08 DIAGNOSIS — K5792 Diverticulitis of intestine, part unspecified, without perforation or abscess without bleeding: Secondary | ICD-10-CM

## 2020-08-08 MED ORDER — IOPAMIDOL (ISOVUE-370) INJECTION 76%
80.0000 mL | Freq: Once | INTRAVENOUS | Status: AC | PRN
  Administered 2020-08-08: 80 mL via INTRAVENOUS

## 2020-09-11 ENCOUNTER — Telehealth: Payer: Self-pay | Admitting: Internal Medicine

## 2020-09-11 NOTE — Telephone Encounter (Signed)
Ok with me 

## 2020-09-11 NOTE — Telephone Encounter (Signed)
Hi Dr. Hilarie Fredrickson,  Patents mother called requesting a transfer of care over from [facility] to you. The patient's mother is currently one of your patients and she said they were not satisfied with the care given at [facility] also mentioned the patient just had a Colectomy procedure at the hospital  in April. Records are available in Epic for you to review and advise on scheduling.    Thanks

## 2020-09-12 ENCOUNTER — Other Ambulatory Visit (HOSPITAL_COMMUNITY): Payer: Self-pay | Admitting: Surgery

## 2020-09-12 ENCOUNTER — Other Ambulatory Visit: Payer: Self-pay | Admitting: Surgery

## 2020-09-12 DIAGNOSIS — Z933 Colostomy status: Secondary | ICD-10-CM

## 2020-09-19 ENCOUNTER — Other Ambulatory Visit: Payer: Self-pay

## 2020-09-19 ENCOUNTER — Ambulatory Visit: Payer: Managed Care, Other (non HMO) | Admitting: Internal Medicine

## 2020-09-19 ENCOUNTER — Encounter: Payer: Self-pay | Admitting: *Deleted

## 2020-09-19 VITALS — BP 128/78 | HR 84 | Ht 63.0 in | Wt 284.0 lb

## 2020-09-19 DIAGNOSIS — Z9049 Acquired absence of other specified parts of digestive tract: Secondary | ICD-10-CM

## 2020-09-19 DIAGNOSIS — K5732 Diverticulitis of large intestine without perforation or abscess without bleeding: Secondary | ICD-10-CM | POA: Diagnosis not present

## 2020-09-19 DIAGNOSIS — Z933 Colostomy status: Secondary | ICD-10-CM

## 2020-09-19 MED ORDER — SUTAB 1479-225-188 MG PO TABS: ORAL_TABLET | ORAL | 0 refills | Status: DC

## 2020-09-19 NOTE — Progress Notes (Signed)
Patient ID: Kristin Sawyer, female   DOB: [birth year], 40 y.o.   MRN: [id number] HPI: Kamoria Lucien is a 40 year old female with a history of complicated diverticulitis status post recent sigmoid resection with descending colostomy, obesity who is seen in consult at the request of [place] Planas, [facility] to follow-up from recent complicated diverticulitis.  She is here today with her 3-year-old son.  She was hospitalized in April with complicated diverticulitis at [facility] and treated for 7 days with IV antibiotics.  She was then released and 2 weeks later readmitted to [facility] with again complicated diverticulitis.  She was treated with IV antibiotics through a PICC line and eventually went for sigmoid resection, abdominal washout and ascending colectomy.  Wound VAC was placed and there was some issues for a while with the wound VAC and so it was removed and she has had wound care and healing by secondary intention.  She reports leading up to her initial hospitalization she had 10 to 14 days of lower abdominal pain.  This was moderate but not severe and so she was trying to avoid going to the emergency room.  She was eating soft foods like applesauce and soups.  Then 1 day she felt a "pop" and felt like something had "burst" and she went to the ER where she was hospitalized.  She recalls having had another episode of diverticulitis about 20 years ago which also involved a 5-day hospitalization.  Now her ostomy is working well.  She has had some peristomal irritation and using ostomy paste.  Ostomy is functioning with a bowel movement about once per day.  No diarrhea and no constipation.  Her abdominal wound is healing and is tender but not overtly painful or draining.  She is doing wet-to-dry dressings.  Home health is involved.  She has seen Dr. Dema Severin who is recommended a colonoscopy prior to ostomy takedown at approximately the 63-month mark.  A barium enema is scheduled for Tuesday.  Her family history is  notable for a mother with longstanding ulcerative colitis.  Past Medical History:  Diagnosis Date   B12 nutritional deficiency    Colonic diverticular abscess    Condyloma    Diverticulitis    Hiatal hernia    History of marijuana use    pre-pregnancy   History of trichomoniasis 03/15/2013   HPV in female    Hx of abnormal cervical Papanicolaou smear    Hx of adenoidectomy    Hx of tonsillectomy    Ovarian cyst    Pulmonary nodule     Past Surgical History:  Procedure Laterality Date   CYSTOSCOPY WITH STENT PLACEMENT Left 07/17/2020   Procedure: CYSTOSCOPY WITH STENT PLACEMENT;  Surgeon: Raynelle Bring, MD;  Location: [place];  Service: Urology;  Laterality: Left;   EXPLORATORY LAPAROTOMY  07/17/2020   with segmental colectomy   PARTIAL COLECTOMY N/A 07/17/2020   Procedure: PARTIAL COLECTOMY;  Surgeon: Jesusita Oka, MD;  Location: [facility] OR;  Service: General;  Laterality: N/A;   TONSILLECTOMY      Outpatient Medications Prior to Visit  Medication Sig Dispense Refill   acetaminophen (TYLENOL) 500 MG tablet Take 2 tablets (1,000 mg total) by mouth every 6 (six) hours as needed for mild pain. 30 tablet 0   esomeprazole (NEXIUM) 20 MG capsule Take 20 mg by mouth in the morning.     famotidine (PEPCID) 10 MG tablet Take 10 mg by mouth at bedtime.     methocarbamol (ROBAXIN) 500 MG tablet Take 2 tablets (  1,000 mg total) by mouth every 6 (six) hours as needed for muscle spasms. 30 tablet 0   Multiple Vitamin (MULTIVITAMIN ADULT PO) Take 1 tablet by mouth daily.     oxyCODONE 10 MG TABS Take 0.5-1 tablets (5-10 mg total) by mouth every 6 (six) hours as needed for moderate pain or severe pain. 25 tablet 0   Probiotic Product (PROBIOTIC PO) Take 1 tablet by mouth daily.     traZODone (DESYREL) 50 MG tablet Take one to two tablets as needed for sleep. 180 tablet 1   busPIRone (BUSPAR) 10 MG tablet Take 10 mg by mouth 2 (two) times daily.     clonazePAM (KLONOPIN) 0.5 MG tablet Take 1  tablet (0.5 mg total) by mouth 2 (two) times daily as needed for anxiety. 20 tablet 0   ondansetron (ZOFRAN-ODT) 4 MG disintegrating tablet Take 1 tablet (4 mg total) by mouth every 6 (six) hours as needed for nausea. 20 tablet 0   sertraline (ZOLOFT) 50 MG tablet Take 1 tablet (50 mg total) by mouth daily. 30 tablet 1   No facility-administered medications prior to visit.    Allergies  Allergen Reactions   Other Other (See Comments)    Rondec DM  Reaction: causes hallucinations    Family History  Problem Relation Age of Onset   Diabetes Mother    Irritable bowel syndrome Mother    Colon polyps Mother 23   Diabetes Father    Heart disease Father    Hyperlipidemia Father    Hypertension Father    Depression Sister    Diabetes Maternal Grandmother    Cancer Maternal Aunt    Alcohol abuse Maternal Uncle     Social History   Tobacco Use   Smoking status: Every Day    Packs/day: 1.00    Pack years: 0.00    Types: Cigarettes   Smokeless tobacco: Never  Substance Use Topics   Alcohol use: No   Drug use: No    ROS: As per history of present illness, otherwise negative  BP 128/78   Pulse 84   Ht 5\' 3"  (1.6 m)   Wt 284 lb (128.8 kg)   BMI 50.31 kg/m  Constitutional: Well-developed and well-nourished. No distress. HEENT: Normocephalic and atraumatic.  No scleral icterus. Cardiovascular: Normal rate, regular rhythm and intact distal pulses. No M/R/G Pulmonary/chest: Effort normal and breath sounds normal. No wheezing, rales or rhonchi. Abdominal: Soft, obese, not overly tender, nondistended. Bowel sounds active throughout.  Colostomy noted in the left lower abdomen with light brown stool in the bag, midline wound is covered and dressing clean dry and intact Extremities: no clubbing, cyanosis, or edema Neurological: Alert and oriented to person place and time. Skin: Skin is warm and dry.  Psychiatric: Normal mood and affect. Behavior is normal.  RELEVANT LABS AND  IMAGING: CBC    Component Value Date/Time   WBC 15.9 (H) 07/23/2020 0349   RBC 3.85 (L) 07/23/2020 0349   HGB 11.1 (L) 07/23/2020 0349   HCT 34.4 (L) 07/23/2020 0349   PLT 454 (H) 07/23/2020 0349   MCV 89.4 07/23/2020 0349   MCH 28.8 07/23/2020 0349   MCHC 32.3 07/23/2020 0349   RDW 14.2 07/23/2020 0349   LYMPHSABS 2.8 07/17/2020 0415   MONOABS 0.6 07/17/2020 0415   EOSABS 0.2 07/17/2020 0415   BASOSABS 0.1 07/17/2020 0415    CMP     Component Value Date/Time   NA 136 07/23/2020 0349   K 3.9 07/23/2020 0349  CL 110 07/23/2020 0349   CO2 21 (L) 07/23/2020 0349   GLUCOSE 99 07/23/2020 0349   BUN 10 07/23/2020 0349   CREATININE 0.88 07/24/2020 0350   CREATININE 0.80 06/14/2016 1035   CALCIUM 8.7 (L) 07/23/2020 0349   PROT 6.1 (L) 07/18/2020 0336   ALBUMIN 2.5 (L) 07/18/2020 0336   AST 30 07/18/2020 0336   ALT 27 07/18/2020 0336   ALKPHOS 48 07/18/2020 0336   BILITOT 0.2 (L) 07/18/2020 0336   GFRNONAA >60 07/24/2020 0350   FINAL MICROSCOPIC DIAGNOSIS:   A. SIGMOID COLON, COLECTOMY:    - Diverticulosis.  Transmural defect.  Four morphologically benign lymph  nodes.     CT ABDOMEN AND PELVIS WITH CONTRAST   TECHNIQUE: Multidetector CT imaging of the abdomen and pelvis was performed using the standard protocol following bolus administration of intravenous contrast.   CONTRAST:  32mL ISOVUE-370 IOPAMIDOL (ISOVUE-370) INJECTION 76%   COMPARISON:  Most recent CT 07/22/2020   FINDINGS: Lower chest: Improved basilar atelectasis from prior exam. Trace pleural effusions and adjacent compressive change. Normal heart size.   Hepatobiliary: Minimal focal fatty infiltration adjacent to the falciform ligament. No focal liver lesion. Unremarkable gallbladder. No calcified gallstone. No biliary dilatation.   Pancreas: Unremarkable. No pancreatic ductal dilatation or surrounding inflammatory changes.   Spleen: Normal in size without focal abnormality.    Adrenals/Urinary Tract: Normal adrenal glands. No hydronephrosis or perinephric edema. Homogeneous renal enhancement with symmetric excretion on delayed phase imaging. Urinary bladder is near completely empty and not well assessed.   Stomach/Bowel: Descending colostomy. Equivocal wall thickening of the subcutaneous colonic segment, with mild edema in the adjacent subcutaneous fat. Stapled off sigmoid colon in the pelvis. No obstruction with enteric contrast reaching the ostomy. Normal appendix and small bowel. Unremarkable stomach.   Vascular/Lymphatic: Normal caliber abdominal aorta. Patent portal vein. No portal venous or mesenteric gas. Multiple prominent retroperitoneal lymph nodes are likely reactive.   Reproductive: Anteverted uterus. Decreased size of fluid attenuating lesion adjacent to the uterine fundus in the left adnexa, currently measuring 8 mm, series 2, image 71. This is contiguous with the left ovary, and it is unclear if this represents a ovarian cyst or adjacent fluid collection. Regardless it is diminished in size. Previous fluid collection superior to the uterus has resolved with only minimal residual stranding.   Other: Drain in place from right pelvis approach, which courses into the left pelvis. There is minimal edema adjacent to the drain tip but no focal fluid collection. No new intra-abdominopelvic fluid collection. Midline laparotomy wound with minimal stranding and trace ill-defined air and fluid about the superior aspect of the wound, for example series 2, image 53. No pelvic free fluid. No free air.   Musculoskeletal: There are no acute or suspicious osseous abnormalities.   IMPRESSION: 1. Descending colostomy. Equivocal wall thickening of the subcutaneous colonic segment with mild edema in the adjacent subcutaneous fat. Recommend correlation for ostomy pain. 2. Previous fluid collection superior to the uterus has resolved with only minimal  residual stranding. Drain in place from right pelvis approach, which courses into the left pelvis. There is minimal edema adjacent to the drain tip but no focal fluid collection. 3. Decreased size of fluid attenuating lesion adjacent to the uterine fundus in the left adnexa, currently measuring 8 mm. This is contiguous with the left ovary, and it is unclear if this represents a ovarian follicle or adjacent fluid collection. Ovarian follicles favored. 4. Midline laparotomy wound with minimal stranding and trace  ill-defined air and fluid about the superior aspect of the wound. No drainable collection. This is likely normal postoperative healing, however recommend correlation for symptoms of soft tissue infection.     Electronically Signed   By: Keith Rake M.D.   On: 08/10/2020 16:46  ASSESSMENT/PLAN: 40 year old female with a history of complicated diverticulitis status post recent sigmoid resection with descending colostomy, obesity who is seen in consult at the request of [place] Planas, [facility] to follow-up from recent complicated diverticulitis.   Complicated diverticulitis with sigmoid perforation status post sigmoidectomy and descending colostomy --she will follow up with Dr. Dema Severin.  He is recommended colonoscopy which is very reasonable prior to colostomy takedown.  I explained to her that I would like her midline abdominal wound to heal completely before we pursue colonoscopy.  Also she has a barium enema scheduled for Tuesday.  This is likely just to evaluate the rectal stump.  I will reach out to Dr. Dema Severin to ensure we perform the endoscopic evaluation that he is requesting. --Colonoscopy via the stoma scheduled in the hospital setting in September --Follow-up barium enema results --Continue wound care and surgical follow-up    JJ:HERDEYCX, Royetta Car, [address] [place],  [place] [id number]

## 2020-09-19 NOTE — Patient Instructions (Signed)
You have been scheduled for a colonoscopy. Please follow written instructions given to you at your visit today.  Please pick up your prep supplies at the pharmacy within the next 1-3 days. If you use inhalers (even only as needed), please bring them with you on the day of your procedure.  If you are age 42 or younger, your body mass index should be between 19-25. Your Body mass index is 50.31 kg/m. If this is out of the aformentioned range listed, please consider follow up with your Primary Care Provider.   __________________________________________________________  The [facility] providers would like to encourage you to use [facility] to communicate with providers for non-urgent requests or questions.  Due to long hold times on the telephone, sending your provider a message by [facility] may be a faster and more efficient way to get a response.  Please allow 48 business hours for a response.  Please remember that this is for non-urgent requests.   Due to recent changes in healthcare laws, you may see the results of your imaging and laboratory studies on MyChart before your provider has had a chance to review them.  We understand that in some cases there may be results that are confusing or concerning to you. Not all laboratory results come back in the same time frame and the provider may be waiting for multiple results in order to interpret others.  Please give [place] 48 hours in order for your provider to thoroughly review all the results before contacting the office for clarification of your results.

## 2020-09-22 ENCOUNTER — Telehealth: Payer: Self-pay | Admitting: *Deleted

## 2020-09-22 NOTE — Telephone Encounter (Signed)
----- Message from Jerene Bears, MD sent at 09/22/2020 10:10 AM EDT ----- Can you contact patient and let her know I touched base with Dr. Dema Severin and the DG barium study tomorrow is to look at rectal stump only and not the ostomy.  Thus no prep is needed and this study shouldn't take long.  It is a required step in eventual ostomy takedown. Thanks JMP  ----- Message ----- From: Ileana Roup, MD Sent: 09/19/2020   6:21 PM EDT To: Jerene Bears, MD  Remonia Richter!  Thank you for the follow-up!! Yes, I had instructed my office to tell her no 'prep' was needed for a gastrograffin enema. These are administered by rads via anus (not ostomy). I still appreciate the endoscopic eval of her stump as well. I use these GGEs for a few reasons prior to ostomy reversal..Marland Kitchen  1) It serves as a roadmap for how long their stump is (ie do they appear to have remnant sigmoid that we need to remove prior to hooking up to healthy rectum) and where does it lay in relation to their sacral promontory.  2) Are there any adverse subtle things we need to prepare to handle - I've had 2 colostomy reversals in the last 4 years where an incidental small bowel fistula was found to their Hartmann stump - generally these are in the setting of a stump staple line dehiscence  that subsequently developed a small abscess which fistulized to a neighboring loop of small bowel - often terminal ileum, which can complicate their reversal (ie ileocecectomy may be necessary concurrently). Both of these patients had no CT evidence of fistula, no significant stool coming from anus and normal scopes.  3) Contrast retention and washout - if they have issue retaining their enema I counsel them about the potential for functional related issues following reversal.  4) Added benefit of irrigating out the retained stool/mucus from their Hartmann pouch  I also realize it seems rather old school and I'm probably the only one in my group that does these,  but think it's useful. All the colorectal surgeons I trained under in fellowship made these enema tests mandatory and I think for good reason.  Jeanette Caprice reversals can be complex procedures. They carry a 20% overall morbidity rate and therefore get some respect from me :). These patients generally have plenty of adhesions, often incisional and parastomal hernias, and fibrotic mesenteries related to the catastrophe that led to an emergent stoma. Sometimes we get in and it's not bad at all, but I always come in prepared.  I hope you have a great weekend as well!  Gerald Stabs ----- Message ----- From: Jerene Bears, MD Sent: 09/19/2020   5:51 PM EDT To: Ileana Roup, MD  Tillie Fantasia saw Alyse Low today in clinic.  Her ostomy appears to be functioning well though she still doing the wet-to-dry dressings as her abdominal wound healed. She is to have a lower GI barium study Tuesday, she reports she called the office and was told she does not need prep.  I wanted to verify that this is just to evaluate the rectal stump? Also I will schedule her for colonoscopy through the descending colostomy in September; I am going to give her abdominal wound time to close and heal completely.  Normally I will look at the rectal stump at the same time.  Would you like me to do so? Just curious why the barium study? Thanks and have a good weekend. Ulice Dash

## 2020-09-22 NOTE — Telephone Encounter (Signed)
I have spoken to patient to advise per Dr Dema Severin, the barium study will only be looking at the rectal stump and that no prep is needed for the study, however, she does need to keep this appointment as it is a required step prior to ostomy takedown. Patient verbalizes understanding of this information.

## 2020-09-23 ENCOUNTER — Ambulatory Visit (HOSPITAL_COMMUNITY)
Admission: RE | Admit: 2020-09-23 | Discharge: 2020-09-23 | Disposition: A | Discharge status: Home / Self Care | Payer: Managed Care, Other (non HMO) | Source: Ambulatory Visit | Attending: Surgery | Admitting: Surgery

## 2020-09-23 ENCOUNTER — Other Ambulatory Visit: Payer: Self-pay

## 2020-09-23 DIAGNOSIS — Z933 Colostomy status: Secondary | ICD-10-CM | POA: Diagnosis not present

## 2020-10-13 ENCOUNTER — Telehealth: Payer: Self-pay | Admitting: Internal Medicine

## 2020-10-13 NOTE — Telephone Encounter (Signed)
Inbound call from patient requesting a call please. Patient has been experiencing pain for the past 3 days.

## 2020-10-13 NOTE — Telephone Encounter (Signed)
Left message on machine to call back  

## 2020-10-14 NOTE — Telephone Encounter (Signed)
Left message on machine to call back  

## 2020-10-20 NOTE — Telephone Encounter (Signed)
Attempted to reach pt again, unable to reach her. No return call received, will await further communication from the pt.

## 2020-12-09 ENCOUNTER — Ambulatory Visit (HOSPITAL_COMMUNITY)
Admission: RE | Admit: 2020-12-09 | Discharge: 2020-12-09 | Disposition: A | Discharge status: Home / Self Care | Payer: Managed Care, Other (non HMO) | Attending: Internal Medicine | Admitting: Internal Medicine

## 2020-12-09 ENCOUNTER — Encounter (HOSPITAL_COMMUNITY): Payer: Self-pay | Admitting: Internal Medicine

## 2020-12-09 ENCOUNTER — Ambulatory Visit (HOSPITAL_COMMUNITY): Payer: Managed Care, Other (non HMO) | Admitting: Certified Registered"

## 2020-12-09 ENCOUNTER — Encounter (HOSPITAL_COMMUNITY)
Admission: RE | Disposition: A | Discharge status: Home / Self Care | Payer: Self-pay | Source: Home / Self Care | Attending: Internal Medicine

## 2020-12-09 ENCOUNTER — Other Ambulatory Visit: Payer: Self-pay

## 2020-12-09 DIAGNOSIS — Z1211 Encounter for screening for malignant neoplasm of colon: Secondary | ICD-10-CM

## 2020-12-09 DIAGNOSIS — K648 Other hemorrhoids: Secondary | ICD-10-CM | POA: Diagnosis not present

## 2020-12-09 DIAGNOSIS — Z79899 Other long term (current) drug therapy: Secondary | ICD-10-CM | POA: Diagnosis not present

## 2020-12-09 DIAGNOSIS — K5289 Other specified noninfective gastroenteritis and colitis: Secondary | ICD-10-CM

## 2020-12-09 DIAGNOSIS — D120 Benign neoplasm of cecum: Secondary | ICD-10-CM | POA: Diagnosis not present

## 2020-12-09 DIAGNOSIS — K529 Noninfective gastroenteritis and colitis, unspecified: Secondary | ICD-10-CM | POA: Insufficient documentation

## 2020-12-09 DIAGNOSIS — Z8719 Personal history of other diseases of the digestive system: Secondary | ICD-10-CM

## 2020-12-09 DIAGNOSIS — F17210 Nicotine dependence, cigarettes, uncomplicated: Secondary | ICD-10-CM | POA: Diagnosis not present

## 2020-12-09 DIAGNOSIS — K9403 Colostomy malfunction: Secondary | ICD-10-CM

## 2020-12-09 DIAGNOSIS — Z9049 Acquired absence of other specified parts of digestive tract: Secondary | ICD-10-CM

## 2020-12-09 DIAGNOSIS — Z8379 Family history of other diseases of the digestive system: Secondary | ICD-10-CM | POA: Insufficient documentation

## 2020-12-09 DIAGNOSIS — Z933 Colostomy status: Secondary | ICD-10-CM | POA: Insufficient documentation

## 2020-12-09 DIAGNOSIS — K5730 Diverticulosis of large intestine without perforation or abscess without bleeding: Secondary | ICD-10-CM | POA: Diagnosis not present

## 2020-12-09 DIAGNOSIS — K5732 Diverticulitis of large intestine without perforation or abscess without bleeding: Secondary | ICD-10-CM

## 2020-12-09 HISTORY — PX: POLYPECTOMY: SHX5525

## 2020-12-09 HISTORY — PX: COLONOSCOPY WITH PROPOFOL: SHX5780

## 2020-12-09 LAB — PREGNANCY, URINE: Preg Test, Ur: NEGATIVE

## 2020-12-09 SURGERY — COLONOSCOPY WITH PROPOFOL
Anesthesia: Monitor Anesthesia Care

## 2020-12-09 MED ORDER — LIDOCAINE 2% (20 MG/ML) 5 ML SYRINGE
INTRAMUSCULAR | Status: DC | PRN
  Administered 2020-12-09: 100 mg via INTRAVENOUS

## 2020-12-09 MED ORDER — PROPOFOL 500 MG/50ML IV EMUL
INTRAVENOUS | Status: DC | PRN
  Administered 2020-12-09: 200 ug/kg/min via INTRAVENOUS

## 2020-12-09 MED ORDER — POLYETHYLENE GLYCOL 3350 17 G PO PACK: 17.0000 g | PACK | Freq: Every day | ORAL | 0 refills | Status: DC

## 2020-12-09 MED ORDER — PHENYLEPHRINE 40 MCG/ML (10ML) SYRINGE FOR IV PUSH (FOR BLOOD PRESSURE SUPPORT)
PREFILLED_SYRINGE | INTRAVENOUS | Status: DC | PRN
  Administered 2020-12-09: 120 ug via INTRAVENOUS

## 2020-12-09 MED ORDER — SODIUM CHLORIDE 0.9 % IV SOLN: INTRAVENOUS | Status: DC

## 2020-12-09 SURGICAL SUPPLY — 21 items

## 2020-12-09 NOTE — Transfer of Care (Signed)
Immediate Anesthesia Transfer of Care Note  Patient: Kristin Sawyer  Procedure(s) Performed: COLONOSCOPY WITH PROPOFOL POLYPECTOMY  Patient Location: Endoscopy Unit  Anesthesia Type:MAC  Level of Consciousness: awake, alert , oriented and patient cooperative  Airway & Oxygen Therapy: Patient Spontanous Breathing and Patient connected to face mask oxygen  Post-op Assessment: Report given to RN, Post -op Vital signs reviewed and stable and Patient moving all extremities  Post vital signs: Reviewed and stable  Last Vitals:  Vitals Value Taken Time  BP    Temp    Pulse    Resp    SpO2      Last Pain:  Vitals:   12/09/20 1200  TempSrc: Oral  PainSc: 0-No pain         Complications: No notable events documented.

## 2020-12-09 NOTE — Anesthesia Preprocedure Evaluation (Addendum)
Anesthesia Evaluation  Patient identified by MRN, date of birth, ID band Patient awake    Reviewed: Allergy & Precautions, H&P , NPO status , Patient's Chart, lab work & pertinent test results  Airway Mallampati: III  TM Distance: >3 FB Neck ROM: Full    Dental no notable dental hx. (+) Teeth Intact, Dental Advisory Given   Pulmonary neg pulmonary ROS, Current Smoker,    Pulmonary exam normal breath sounds clear to auscultation       Cardiovascular negative cardio ROS   Rhythm:Regular Rate:Normal     Neuro/Psych  Headaches, Anxiety Depression    GI/Hepatic Neg liver ROS, hiatal hernia,   Endo/Other  Morbid obesity  Renal/GU negative Renal ROS  negative genitourinary   Musculoskeletal   Abdominal   Peds  Hematology  (+) Blood dyscrasia, anemia ,   Anesthesia Other Findings   Reproductive/Obstetrics negative OB ROS                           Anesthesia Physical Anesthesia Plan  ASA: 2  Anesthesia Plan: MAC   Post-op Pain Management:    Induction: Intravenous  PONV Risk Score and Plan: 1 and Propofol infusion  Airway Management Planned: Simple Face Mask  Additional Equipment:   Intra-op Plan:   Post-operative Plan:   Informed Consent: I have reviewed the patients History and Physical, chart, labs and discussed the procedure including the risks, benefits and alternatives for the proposed anesthesia with the patient or authorized representative who has indicated his/her understanding and acceptance.     Dental advisory given  Plan Discussed with: CRNA  Anesthesia Plan Comments:         Anesthesia Quick Evaluation

## 2020-12-09 NOTE — H&P (Signed)
GASTROENTEROLOGY PROCEDURE H&P NOTE   Primary Care Physician: Donella Stade, PA-C    Reason for Procedure:  History of complicated colitis with prior sigmoid resection and descending colostomy  Plan:    Colonoscopy via stoma and per rectum to evaluate rectal stump  The nature of the procedure, as well as the risks, benefits, and alternatives were carefully and thoroughly reviewed with the patient. Ample time for discussion and questions allowed. The patient understood, was satisfied, and agreed to proceed.     HPI: Kristin Sawyer is a 40 y.o. female who presents for outpatient colonoscopy in the hospital setting.  Seen in the office on 09/19/2020 and see this note for details.  No significant changes in medical history since this time.  Prep caused vomiting which she was able to tolerate.  She did have a barium enema which did not show an issue with the rectal stump but Dr. Dema Severin asked for direct visualization as well.  Hoping for reversal of colostomy later this year.  No chest pain or shortness of breath.  No abdominal pain.  Past Medical History:  Diagnosis Date   B12 nutritional deficiency    Colonic diverticular abscess    Condyloma    Diverticulitis    Hiatal hernia    History of marijuana use    pre-pregnancy   History of trichomoniasis 03/15/2013   HPV in female    Hx of abnormal cervical Papanicolaou smear    Hx of adenoidectomy    Hx of tonsillectomy    Ovarian cyst    Pulmonary nodule     Past Surgical History:  Procedure Laterality Date   CYSTOSCOPY WITH STENT PLACEMENT Left 07/17/2020   Procedure: CYSTOSCOPY WITH STENT PLACEMENT;  Surgeon: Raynelle Bring, MD;  Location: [facility];  Service: Urology;  Laterality: Left;   EXPLORATORY LAPAROTOMY  07/17/2020   with segmental colectomy   PARTIAL COLECTOMY N/A 07/17/2020   Procedure: PARTIAL COLECTOMY;  Surgeon: Jesusita Oka, MD;  Location: [facility];  Service: General;  Laterality: N/A;   TONSILLECTOMY       Prior to Admission medications   Medication Sig Start Date End Date Taking? Authorizing Provider  ibuprofen (ADVIL) 200 MG tablet Take 400 mg by mouth every 8 (eight) hours as needed for moderate pain or headache.   Yes [provider]  acetaminophen (TYLENOL) 500 MG tablet Take 2 tablets (1,000 mg total) by mouth every 6 (six) hours as needed for mild pain. Patient not taking: Reported on 12/02/2020 07/24/20   Margie Billet A, PA-C  methocarbamol (ROBAXIN) 500 MG tablet Take 2 tablets (1,000 mg total) by mouth every 6 (six) hours as needed for muscle spasms. Patient not taking: Reported on 12/02/2020 07/25/20   Margie Billet A, PA-C  oxyCODONE 10 MG TABS Take 0.5-1 tablets (5-10 mg total) by mouth every 6 (six) hours as needed for moderate pain or severe pain. Patient not taking: Reported on 12/02/2020 07/25/20   Wellington Hampshire, PA-C  Sodium Sulfate-Mag Sulfate-KCl (SUTAB) [phone] MG TABS Use as directed for colonoscopy. MANUFACTURER CODES!! BIN: [id number] PCN: CN GROUP: [id number] MEMBER ID: [id number];SEG AS SECONDARY INSURANCE ;NO PRIOR AUTHORIZATION Patient not taking: Reported on 12/02/2020 09/19/20   Jerene Bears, MD    Current Facility-Administered Medications  Medication Dose Route Frequency Provider Last Rate Last Admin   0.9 %  sodium chloride infusion   Intravenous Continuous Curtis Cain, Lajuan Lines, MD        Allergies as of 09/19/2020 -  Review Complete 09/19/2020  Allergen Reaction Noted   Other Other (See Comments) 11/29/2013    Family History  Problem Relation Age of Onset   Diabetes Mother    Irritable bowel syndrome Mother    Colon polyps Mother 38   Diabetes Father    Heart disease Father    Hyperlipidemia Father    Hypertension Father    Depression Sister    Diabetes Maternal Grandmother    Cancer Maternal Aunt    Alcohol abuse Maternal Uncle     Social History   Socioeconomic History   Marital status: Single    Spouse name: Not on file   Number of  children: Not on file   Years of education: Not on file   Highest education level: Not on file  Occupational History   Not on file  Tobacco Use   Smoking status: Every Day    Packs/day: 1.00    Types: Cigarettes   Smokeless tobacco: Never  Substance and Sexual Activity   Alcohol use: No   Drug use: No   Sexual activity: Not Currently  Other Topics Concern   Not on file  Social History Narrative   Not on file   Social Determinants of Health   Financial Resource Strain: Not on file  Food Insecurity: Not on file  Transportation Needs: Not on file  Physical Activity: Not on file  Stress: Not on file  Social Connections: Not on file  Intimate Partner Violence: Not on file    Physical Exam: Vital signs in last 24 hours: @BP  138/62   Pulse 70   Temp 98.3 F (36.8 C) (Oral)   Resp 16   SpO2 100%  GEN: NAD EYE: Sclerae anicteric ENT: MMM CV: Non-tachycardic Pulm: CTA b/l GI: Soft, NT/ND, colostomy left mid abdomen NEURO:  Alert & Oriented x 3   [name], MD [facility]  12/09/2020 12:32 PM

## 2020-12-09 NOTE — Anesthesia Postprocedure Evaluation (Signed)
Anesthesia Post Note  Patient: Armed forces technical officer  Procedure(s) Performed: COLONOSCOPY WITH PROPOFOL POLYPECTOMY     Patient location during evaluation: Endoscopy Anesthesia Type: MAC Level of consciousness: awake and alert Pain management: pain level controlled Vital Signs Assessment: post-procedure vital signs reviewed and stable Respiratory status: spontaneous breathing, nonlabored ventilation and respiratory function stable Cardiovascular status: stable and blood pressure returned to baseline Postop Assessment: no apparent nausea or vomiting Anesthetic complications: no   No notable events documented.  Last Vitals:  Vitals:   12/09/20 1400 12/09/20 1405  BP: 125/67 122/60  Pulse: 66 69  Resp: 15 20  Temp:    SpO2: 98% 96%    Last Pain:  Vitals:   12/09/20 1400  TempSrc:   PainSc: 0-No pain                 Ramon Brant,W. EDMOND

## 2020-12-09 NOTE — Op Note (Signed)
[facility] Patient Name: Kristin Sawyer Procedure Date: 12/09/2020 MRN: [id number] Attending MD: Jerene Bears , MD Date of Birth: 09/10/1980 CSN: [id number] Age: 40 Admit Type: Outpatient Procedure:                Colonoscopy Indications:              History of complicated diverticulitis requiring                            sigmoid resection and diverting descending                            colostomy in May 2022, plans for colostomy                            take-down later this year, 1st colonoscopy Providers:                Lajuan Lines. Hilarie Fredrickson, MD, Dulcy Fanny, Cherylynn Ridges,                            Technician Referring MD:             Sharon Mt. White, MD Medicines:                Monitored Anesthesia Care Complications:            No immediate complications. Estimated Blood Loss:     Estimated blood loss was minimal. Procedure:                Pre-Anesthesia Assessment:                           - Prior to the procedure, a History and Physical                            was performed, and patient medications and                            allergies were reviewed. The patient's tolerance of                            previous anesthesia was also reviewed. The risks                            and benefits of the procedure and the sedation                            options and risks were discussed with the patient.                            All questions were answered, and informed consent                            was obtained. Prior Anticoagulants: The patient has  taken no previous anticoagulant or antiplatelet                            agents. ASA Grade Assessment: III - A patient with                            severe systemic disease. After reviewing the risks                            and benefits, the patient was deemed in                            satisfactory condition to undergo the procedure.                            After obtaining informed consent, the colonoscope                            was passed under direct vision. Throughout the                            procedure, the patient's blood pressure, pulse, and                            oxygen saturations were monitored continuously. The                            PCF-H190TL ([id number]) Olympus slim colonoscope was                            introduced through the descending colostomy and                            advanced to the the cecum, identified by                            appendiceal orifice and ileocecal valve. The                            colonoscope was also advanced via the anus to the                            rectal stump. The colonoscopy was performed without                            difficulty. The patient tolerated the procedure                            well. The quality of the bowel preparation was                            good. The ileocecal valve, appendiceal orifice, and  rectum were photographed. Scope In: 12:50:57 PM Scope Out: 1:33:42 PM Total Procedure Duration: 0 hours 42 minutes 45 seconds  Findings:      The digital rectal exam was normal.      Scattered mild mucosal changes characterized by erythema were found in       the recto-sigmoid colon. This is consistent with diversion colitis. No       polypoid lesions seen.      Internal hemorrhoids were found during direct colonoscopy. The       hemorrhoids were small.      There was evidence of a patent but strictured end colostomy in the       descending colon. This was characterized by severe stenosis and an       intact staple line. Initially the adult colonoscope nor the ultraslim       colonoscope would traverse the stricture. A lubricated 5th finger was       used gentle and slowly to dilate the ostomy skin stricture. This was       successful and thereafter the ultraslim colonoscope was advanced into       the descending  colon.      A 5 mm polyp was found in the cecum. The polyp was sessile. The polyp       was removed with a cold snare. Resection and retrieval were complete.      A few small-mouthed diverticula were found in the descending colon.      The exam was otherwise without abnormality. Impression:               - Scattered mild mucosal changes were found in the                            recto-sigmoid colon secondary to diversion colitis.                           - No polyps seen in the rectum, rectosigmoid or                            very distal sigmoid colon to the level of the stump.                           - Small internal hemorrhoids.                           - Patent but strictured end colostomy with severe                            stenosis and an intact staple line in the                            descending colon. Manual dilation to allow passage                            of the Ultraslim colonscope.                           - One 5 mm polyp in the cecum, removed with a cold  snare. Resected and retrieved.                           - Mild diverticulosis in the descending colon.                           - The examination was otherwise normal. Moderate Sedation:      N/A Recommendation:           - Patient has a contact number available for                            emergencies. The signs and symptoms of potential                            delayed complications were discussed with the                            patient. Return to normal activities tomorrow.                            Written discharge instructions were provided to the                            patient.                           - Resume previous diet.                           - Continue present medications.                           - Once to twice daily MiraLax is recommended to                            avoid hard stools given significant ostomy stenosis.                            - Await pathology results.                           - Repeat colonoscopy is recommended. The                            colonoscopy date will be determined after pathology                            results from today's exam become available for                            review.                           - Follow-up as planned with Dr. Dema Severin to discuss  ostomy reversal. Procedure Code(s):        --- Professional ---                           [phone], Colonoscopy, flexible; with removal of                            tumor(s), polyp(s), or other lesion(s) by snare                            technique Diagnosis Code(s):        --- Professional ---                           K52.89, Other specified noninfective                            gastroenteritis and colitis                           Z93.3, Colostomy status                           K63.5, Polyp of colon                           K64.8, Other hemorrhoids                           K57.32, Diverticulitis of large intestine without                            perforation or abscess without bleeding                           K57.30, Diverticulosis of large intestine without                            perforation or abscess without bleeding CPT copyright 2019 American Medical Association. All rights reserved. The codes documented in this report are preliminary and upon coder review may  be revised to meet current compliance requirements. Jerene Bears, MD 12/09/2020 1:48:41 PM This report has been signed electronically. Number of Addenda: 0

## 2020-12-10 ENCOUNTER — Encounter (HOSPITAL_COMMUNITY): Payer: Self-pay | Admitting: Internal Medicine

## 2020-12-10 ENCOUNTER — Encounter: Payer: Self-pay | Admitting: Internal Medicine

## 2020-12-10 LAB — SURGICAL PATHOLOGY

## 2021-02-11 NOTE — Progress Notes (Signed)
Sent message, via epic in basket, requesting orders in epic from surgeon.  

## 2021-02-16 ENCOUNTER — Telehealth: Payer: Self-pay | Admitting: Internal Medicine

## 2021-02-16 NOTE — Telephone Encounter (Signed)
Inbound call from patient mother. States patient need a colostomy dilation and says Dr. Hilarie Fredrickson states to call him if needed. Best contact number [phone]

## 2021-02-16 NOTE — Telephone Encounter (Signed)
Left message for pt's mother to call back.

## 2021-02-17 ENCOUNTER — Other Ambulatory Visit: Payer: Self-pay

## 2021-02-17 ENCOUNTER — Ambulatory Visit: Payer: Self-pay | Admitting: Surgery

## 2021-02-17 DIAGNOSIS — K929 Disease of digestive system, unspecified: Secondary | ICD-10-CM

## 2021-02-17 DIAGNOSIS — Z01818 Encounter for other preprocedural examination: Secondary | ICD-10-CM

## 2021-02-17 NOTE — Telephone Encounter (Signed)
Pt is scheduled for surgery on 12/21 for colostomy take down with Dr. Dema Severin. She is afraid she won't make it til the surgery. States she is having to take laxatives at least weekly to have anything come out of her stoma. Reports the area is closing up, reports it is about half the size of her pinky nail. Sat she took 4 dulcolax chews, Sunday 4 of the chews and yesterday 3 of the chews and finally after lots of bad cramping she finally had some stool come out. She is requesting a dilation of the stoma. Please advise.

## 2021-02-17 NOTE — Telephone Encounter (Signed)
Pt aware of appt and recommendations. Pt sent instructions via mychart, amb referral in epic.

## 2021-02-17 NOTE — Telephone Encounter (Signed)
Linda,  I do not have additional procedure time this week. Please place her on for flex sig via stoma in [place] 2 pm 02/23/21 Until then she can use dulcolax, but I would recommend MiraLax 17 g BID to TID to keep stools almost liquid Low fiber diet

## 2021-02-19 ENCOUNTER — Other Ambulatory Visit: Payer: Self-pay | Admitting: Urology

## 2021-02-23 ENCOUNTER — Encounter: Payer: Self-pay | Admitting: Internal Medicine

## 2021-02-23 ENCOUNTER — Ambulatory Visit (AMBULATORY_SURGERY_CENTER): Payer: Managed Care, Other (non HMO) | Admitting: Internal Medicine

## 2021-02-23 ENCOUNTER — Other Ambulatory Visit: Payer: Self-pay

## 2021-02-23 VITALS — BP 111/56 | HR 58 | Temp 97.8°F | Resp 14 | Ht 63.0 in | Wt 270.0 lb

## 2021-02-23 DIAGNOSIS — K9403 Colostomy malfunction: Secondary | ICD-10-CM | POA: Diagnosis present

## 2021-02-23 DIAGNOSIS — K929 Disease of digestive system, unspecified: Secondary | ICD-10-CM | POA: Diagnosis not present

## 2021-02-23 MED ORDER — SODIUM CHLORIDE 0.9 % IV SOLN: 500.0000 mL | Freq: Once | INTRAVENOUS | Status: DC

## 2021-02-23 NOTE — Op Note (Signed)
[facility] Patient Name: Kristin Sawyer Procedure Date: 02/23/2021 2:01 PM MRN: [id number] Endoscopist: Jerene Bears , MD Age: 40 Referring MD:  Date of Birth: Dec 06, 1980 Gender: Female Account #: [id number] Procedure:                Flexible Sigmoidoscopy Indications:              Generalized abdominal pain, dysfunction colostomy                            with stenosis Medicines:                Monitored Anesthesia Care Procedure:                Pre-Anesthesia Assessment:                           - Prior to the procedure, a History and Physical                            was performed, and patient medications and                            allergies were reviewed. The patient's tolerance of                            previous anesthesia was also reviewed. The risks                            and benefits of the procedure and the sedation                            options and risks were discussed with the patient.                            All questions were answered, and informed consent                            was obtained. Prior Anticoagulants: The patient has                            taken no previous anticoagulant or antiplatelet                            agents. ASA Grade Assessment: III - A patient with                            severe systemic disease. After reviewing the risks                            and benefits, the patient was deemed in                            satisfactory condition to undergo the procedure.  After obtaining informed consent, the scope was                            passed under direct vision. The scope was advanced                            to the colostomy orifice. The flexible                            sigmoidoscopy was technically difficult and complex                            due to post-surgical anatomy with severe skin                            ostomy stenosis. Scope In: Scope  Out: Findings:                 Colotomy in left middle abdomen. The ostomy is flat                            to the skin with severe skin stenosis. Despite                            aggressive attempts to open this stenotic segment                            using lubricated finger I was unable to open the                            stenotic area in an meaninful way. There was                            self-limited oozing. Internal exam not performed                            today. Complications:            No immediate complications. Estimated Blood Loss:     Estimated blood loss was minimal. Impression:               - Colostomy with severe skin stenosis at level of                            ostomy orifice. Partial, though incomplete digital                            dilation.                           - No specimens collected. Recommendation:           - Patient has a contact number available for                            emergencies. The signs and symptoms of potential  delayed complications were discussed with the                            patient. Return to normal activities tomorrow.                            Written discharge instructions were provided to the                            patient.                           - Continue present medications.                           - MiraLax 17 g 1-3 times daily to keep stools very                            soft to liquid.                           - I discusssed the situation by phone with Dr.                            Dema Severin. Patient will need to prep for colostomy                            take-down on 03/04/21 which will be a challenge                            with the severe skin stricture. Thus he will have                            his office reach out to her with plans to incise                            the stricture after local anesthesia later this                             week. Jerene Bears, MD 02/23/2021 2:32:25 PM This report has been signed electronically.

## 2021-02-23 NOTE — Progress Notes (Signed)
Vocal order per Dr. [name], low residue diet.    When I was going over discharge instructions with pt and her mother, pt said she only has 9 days till colectomy take down.  She did not want to do procedure to open stoma.  Dr. Hilarie Fredrickson spoke with pt and her mother regarding he was concerned that she may not be able to prep and the surgery would be cancelled.  Pt did say to me that she was not going to do the procure to open stoma.  I said if you decide differently to please call us back.  Dr. Hilarie Fredrickson said he was going to notify surgeon.  No problems noted in the recovery room. maw

## 2021-02-23 NOTE — Patient Instructions (Addendum)
Miralax 17 gm  9 1 capful) 1-3 times daily to keep stools very soft to liquid. Low residue diet. You may resume your current medications today. Dr. Orest Dikes office will call you with plans to incise the stricture after local anesthesia later this week.  Dr. Tawanna Cooler office will also go over instructions with you for up coming colostomy take-down on 03/04/21. Please call if any questions or concerns.       YOU HAD AN ENDOSCOPIC PROCEDURE TODAY AT [facility]:   Refer to the procedure report that was given to you for any specific questions about what was found during the examination.  If the procedure report does not answer your questions, please call your gastroenterologist to clarify.  If you requested that your care partner not be given the details of your procedure findings, then the procedure report has been included in a sealed envelope for you to review at your convenience later.  YOU SHOULD EXPECT: Some feelings of bloating in the abdomen. Passage of more gas than usual.  Walking can help get rid of the air that was put into your GI tract during the procedure and reduce the bloating. If you had a lower endoscopy (such as a colonoscopy or flexible sigmoidoscopy) you may notice spotting of blood in your stool or on the toilet paper. If you underwent a bowel prep for your procedure, you may not have a normal bowel movement for a few days.  Please Note:  You might notice some irritation and congestion in your nose or some drainage.  This is from the oxygen used during your procedure.  There is no need for concern and it should clear up in a day or so.  SYMPTOMS TO REPORT IMMEDIATELY:  Following lower endoscopy (colonoscopy or flexible sigmoidoscopy):  Excessive amounts of blood in the stool  Significant tenderness or worsening of abdominal pains  Swelling of the abdomen that is new, acute  Fever of 100F or higher   For urgent or emergent issues, a gastroenterologist can be  reached at any hour by calling [phone]. Do not use MyChart messaging for urgent concerns.    DIET:  We do recommend a small meal at first, but then you may proceed to a low residue diet.  Stay on the low residue diet until stricture improves.   Drink plenty of fluids but you should avoid alcoholic beverages for 24 hours.  ACTIVITY:  You should plan to take it easy for the rest of today and you should NOT DRIVE or use heavy machinery until tomorrow (because of the sedation medicines used during the test).    FOLLOW UP: Our staff will call the number listed on your records 48-72 hours following your procedure to check on you and address any questions or concerns that you may have regarding the information given to you following your procedure. If we do not reach you, we will leave a message.  We will attempt to reach you two times.  During this call, we will ask if you have developed any symptoms of COVID 19. If you develop any symptoms (ie: fever, flu-like symptoms, shortness of breath, cough etc.) before then, please call [phone].  If you test positive for Covid 19 in the 2 weeks post procedure, please call and report this information to [place].    If any biopsies were taken you will be contacted by phone or by letter within the next 1-3 weeks.  Please call us at [phone] if you  have not heard about the biopsies in 3 weeks.    SIGNATURES/CONFIDENTIALITY: You and/or your care partner have signed paperwork which will be entered into your electronic medical record.  These signatures attest to the fact that that the information above on your After Visit Summary has been reviewed and is understood.  Full responsibility of the confidentiality of this discharge information lies with you and/or your care-partner.

## 2021-02-23 NOTE — Progress Notes (Signed)
GASTROENTEROLOGY PROCEDURE H&P NOTE   Primary Care Physician: Donella Stade, PA-C    Reason for Procedure:  Stenosis at the level of the descending colostomy orifice  Plan:    Flexible sigmoidoscopy via stoma with dilation  Patient is appropriate for endoscopic procedure(s) in the ambulatory ([facility]) setting.  The nature of the procedure, as well as the risks, benefits, and alternatives were carefully and thoroughly reviewed with the patient. Ample time for discussion and questions allowed. The patient understood, was satisfied, and agreed to proceed.     HPI: Kristin Sawyer is a 40 y.o. female who presents for dilation due to recurrent peristomal stenosis.  Colonoscopy performed in September after dilation of the ostomy orifice.  Despite laxatives patient has had continuing issues with stool passing via ostomy.  She is scheduled for ostomy takedown later this month with Dr. Dema Severin.  No chest pain or shortness of breath today.  No abdominal pain.  Past Medical History:  Diagnosis Date   B12 nutritional deficiency    Colonic diverticular abscess    Condyloma    Diverticulitis    Hiatal hernia    History of marijuana use    pre-pregnancy   History of trichomoniasis 03/15/2013   HPV in female    Hx of abnormal cervical Papanicolaou smear    Hx of adenoidectomy    Hx of tonsillectomy    Ovarian cyst    Pulmonary nodule     Past Surgical History:  Procedure Laterality Date   COLONOSCOPY WITH PROPOFOL N/A 12/09/2020   Procedure: COLONOSCOPY WITH PROPOFOL;  Surgeon: Jerene Bears, MD;  Location: [facility];  Service: Gastroenterology;  Laterality: N/A;   COLOSTOMY     CYSTOSCOPY WITH STENT PLACEMENT Left 07/17/2020   Procedure: CYSTOSCOPY WITH STENT PLACEMENT;  Surgeon: Raynelle Bring, MD;  Location: [facility];  Service: Urology;  Laterality: Left;   EXPLORATORY LAPAROTOMY  07/17/2020   with segmental colectomy   PARTIAL COLECTOMY N/A 07/17/2020   Procedure: PARTIAL  COLECTOMY;  Surgeon: Jesusita Oka, MD;  Location: [facility];  Service: General;  Laterality: N/A;   POLYPECTOMY  12/09/2020   Procedure: POLYPECTOMY;  Surgeon: Jerene Bears, MD;  Location: [facility];  Service: Gastroenterology;;   POLYPECTOMY     TONSILLECTOMY      Prior to Admission medications   Medication Sig Start Date End Date Taking? Authorizing Provider  ibuprofen (ADVIL) 200 MG tablet Take 400 mg by mouth every 8 (eight) hours as needed for moderate pain or headache.   Yes [provider]  polyethylene glycol (MIRALAX) 17 g packet Take 17 g by mouth daily. 12/09/20  Yes Jammal Sarr, Lajuan Lines, MD  acetaminophen (TYLENOL) 500 MG tablet Take 2 tablets (1,000 mg total) by mouth every 6 (six) hours as needed for mild pain. Patient not taking: Reported on 12/02/2020 07/24/20   Margie Billet A, PA-C  methocarbamol (ROBAXIN) 500 MG tablet Take 2 tablets (1,000 mg total) by mouth every 6 (six) hours as needed for muscle spasms. Patient not taking: Reported on 12/02/2020 07/25/20   Margie Billet A, PA-C  oxyCODONE 10 MG TABS Take 0.5-1 tablets (5-10 mg total) by mouth every 6 (six) hours as needed for moderate pain or severe pain. Patient not taking: Reported on 12/02/2020 07/25/20   Wellington Hampshire, PA-C    Current Outpatient Medications  Medication Sig Dispense Refill   ibuprofen (ADVIL) 200 MG tablet Take 400 mg by mouth every 8 (eight) hours as needed for moderate pain  or headache.     polyethylene glycol (MIRALAX) 17 g packet Take 17 g by mouth daily. 14 each 0   acetaminophen (TYLENOL) 500 MG tablet Take 2 tablets (1,000 mg total) by mouth every 6 (six) hours as needed for mild pain. (Patient not taking: Reported on 12/02/2020) 30 tablet 0   methocarbamol (ROBAXIN) 500 MG tablet Take 2 tablets (1,000 mg total) by mouth every 6 (six) hours as needed for muscle spasms. (Patient not taking: Reported on 12/02/2020) 30 tablet 0   oxyCODONE 10 MG TABS Take 0.5-1 tablets (5-10 mg total) by mouth  every 6 (six) hours as needed for moderate pain or severe pain. (Patient not taking: Reported on 12/02/2020) 25 tablet 0   Current Facility-Administered Medications  Medication Dose Route Frequency Provider Last Rate Last Admin   0.9 %  sodium chloride infusion  500 mL Intravenous Once Sreenidhi Ganson, Lajuan Lines, MD        Allergies as of 02/23/2021 - Review Complete 02/23/2021  Allergen Reaction Noted   Other Other (See Comments) 11/29/2013    Family History  Problem Relation Age of Onset   Diabetes Mother    Irritable bowel syndrome Mother    Colon polyps Mother 42   Diabetes Father    Heart disease Father    Hyperlipidemia Father    Hypertension Father    Depression Sister    Pancreatic cancer Maternal Aunt    Cancer Maternal Aunt    Alcohol abuse Maternal Uncle    Diabetes Maternal Grandmother     Social History   Socioeconomic History   Marital status: Single    Spouse name: Not on file   Number of children: Not on file   Years of education: Not on file   Highest education level: Not on file  Occupational History   Not on file  Tobacco Use   Smoking status: Every Day    Packs/day: 1.00    Types: Cigarettes   Smokeless tobacco: Never  Substance and Sexual Activity   Alcohol use: No   Drug use: No   Sexual activity: Not Currently  Other Topics Concern   Not on file  Social History Narrative   Not on file   Social Determinants of Health   Financial Resource Strain: Not on file  Food Insecurity: Not on file  Transportation Needs: Not on file  Physical Activity: Not on file  Stress: Not on file  Social Connections: Not on file  Intimate Partner Violence: Not on file    Physical Exam: Vital signs in last 24 hours: @BP  (!) 102/51   Pulse (!) 55   Temp 97.8 F (36.6 C)   Ht 5\' 3"  (1.6 m)   Wt 270 lb (122.5 kg)   SpO2 100%   BMI 47.83 kg/m  GEN: NAD EYE: Sclerae anicteric ENT: MMM CV: Non-tachycardic Pulm: CTA b/l GI: Soft, NT/ND NEURO:  Alert & Oriented  x 3   [name], MD [facility]  02/23/2021 1:57 PM

## 2021-02-23 NOTE — Progress Notes (Signed)
Pt's states no medical or surgical changes since previsit or office visit. VS by DT. °

## 2021-02-25 ENCOUNTER — Telehealth: Payer: Self-pay

## 2021-02-25 ENCOUNTER — Other Ambulatory Visit (HOSPITAL_COMMUNITY): Payer: Self-pay

## 2021-02-25 NOTE — Telephone Encounter (Signed)
°  Follow up Call-  Call back number 02/23/2021  Post procedure Call Back phone  # [phone]  Permission to leave phone message Yes  Some recent data might be hidden     Patient questions:  Do you have a fever, pain , or abdominal swelling? No. Pain Score  0 *  Have you tolerated food without any problems? Yes.    Have you been able to return to your normal activities? Yes.    Do you have any questions about your discharge instructions: Diet   No. Medications  No. Follow up visit  No.  Do you have questions or concerns about your Care? No.  Actions: * If pain score is 4 or above: No action needed, pain <4.

## 2021-02-25 NOTE — Progress Notes (Addendum)
Anesthesia Review:  PCP: Deboraha Sprang  Last visit video visit on 08/04/20.   Cardiologist : Chest x-ray : EKG : Echo : Stress test: Cardiac Cath :  Activity level:  Sleep Study/ CPAP : Fasting Blood Sugar :      / Checks Blood Sugar -- times a day:   Blood Thinner/ Instructions /Last Dose: ASA / Instructions/ Last Dose :   PT came in today with a child for preop.  No children allowed in hospital.  PT was sent back to car and completed as a phone cal.  Medical history and preop instructions were reviewed.  PT to come in on 03/02/2021 for labs and EKG and also to pick up hibiclens soap and Ensure preop drink and preop instrucitons.  Instructions for covid test on 03/02/21 [place] in bag along with requisition.  PT aware.

## 2021-02-25 NOTE — Progress Notes (Signed)
DUE TO COVID-19 ONLY ONE VISITOR IS ALLOWED TO COME WITH YOU AND STAY IN THE WAITING ROOM ONLY DURING PRE OP AND PROCEDURE DAY OF SURGERY IF YOU ARE GOING HOME AFTER YOUR SURGERY .IF YOU ARE SPENDING THE NIGHT YOU MAY HAVE 2 VISITORS DAY OF SURGERY WHO MAY VISIT IN YOUR PRIVATE ROOM UNTIL 800 PM AND MUST GO HOME WHEN VISITING HOURS ARE OVER AT 800 PM.   YOU NEED TO HAVE A COVID 19 TEST ON____12/19/2022 ___ @_______ , THIS TEST MUST BE DONE BEFORE SURGERY,  COVID TESTING SITE IS AT [address], [place], AND REMAIN IN YOUR CAR.  THIS IS A DRIVE UP TEST. AFTER YOUR COVID TEST, PLAESE WEAR A MASK OUT IN PUBLIC AND SOCIAL DISTANCE AND [place] YOUR HANDS FREQUENTLY. PLEASE ASK ALL YOUR CLOSE HOUSEHOLD CONTACTS TO WEAR MASK OUT IN PUBLIC AND SOCIAL DISTANCE ALSO.                Kristin Sawyer  02/25/2021   Your procedure is scheduled on:  03/04/2021   Report to [facility] Main  Entrance   Report to admitting at      [facility]     Call this number if you have problems the morning of surgery [phone]           REMEMBER: FOLLOW ALL YOUR BOWEL PREP INSTRUCTIONS WITH CLEAR LIQUIDS FROM YOUR SURGEON'S INSTRUCTIONS. DRINK 2 PRESURGERY ENSURE DRINKS THE NIGHT BEFORE SURGERY AT 1000 PM AND 1 PRESURGERY DRINK THE DAY OF THE PROCEDURE 3 HOURS PRIOR TO SCHEDULED SURGERY.  NOTHING BY MOUTH EXCEPT CLEAR LIQUIDS UNTIL THREE HOURS PRIOR TO SCHEDULED SURGERY. PLEASE FINISH PRESURGERY 3RD  ENSURE  DRINK PER SURGEON ORDER 3 HOURS PRIOR TO SCHEDULED SURGERY TIME WHICH NEEDS TO BE COMPLETED AT ___ 0530am ______.     CLEAR LIQUID DIET   Foods Allowed                                                                      Coffee and tea, regular and decaf                           Plain Jell-O any favor except red or purple                                         Fruit ices (not with fruit pulp)                                      Iced Popsicles                                     Carbonated  beverages, regular and diet                                    Cranberry, grape and apple juices Sports drinks like Gatorade Lightly  seasoned clear broth or consume(fat free) Sugar  _____________________________________________________________________      BRUSH YOUR TEETH MORNING OF SURGERY AND RINSE YOUR MOUTH OUT, NO CHEWING GUM CANDY OR MINTS.     Take these medicines the morning of surgery with A SIP OF WATER:  none   DO NOT TAKE ANY DIABETIC MEDICATIONS DAY OF YOUR SURGERY                               You may not have any metal on your body including hair pins and              piercings  Do not wear jewelry, make-up, lotions, powders or perfumes, deodorant             Do not wear nail polish on your fingernails.  Do not shave  48 hours prior to surgery.              Men may shave face and neck.   Do not bring valuables to the hospital. [facility].  Contacts, dentures or bridgework may not be worn into surgery.  Leave suitcase in the car. After surgery it may be brought to your room.                 Please read over the following fact sheets you were given: _____________________________________________________________________  [facility] - Preparing for Surgery Before surgery, you can play an important role.  Because skin is not sterile, your skin needs to be as free of germs as possible.  You can reduce the number of germs on your skin by washing with CHG (chlorahexidine gluconate) soap before surgery.  CHG is an antiseptic cleaner which kills germs and bonds with the skin to continue killing germs even after washing. Please DO NOT use if you have an allergy to CHG or antibacterial soaps.  If your skin becomes reddened/irritated stop using the CHG and inform your nurse when you arrive at Short Stay. Do not shave (including legs and underarms) for at least 48 hours prior to the first CHG shower.  You may shave your  face/neck. Please follow these instructions carefully:  1.  Shower with CHG Soap the night before surgery and the  morning of Surgery.  2.  If you choose to wash your hair, wash your hair first as usual with your  normal  shampoo.  3.  After you shampoo, rinse your hair and body thoroughly to remove the  shampoo.                           4.  Use CHG as you would any other liquid soap.  You can apply chg directly  to the skin and wash                       Gently with a scrungie or clean washcloth.  5.  Apply the CHG Soap to your body ONLY FROM THE NECK DOWN.   Do not use on face/ open                           Wound or open sores. Avoid contact with eyes, ears mouth and genitals (private parts).  Wash face,  Genitals (private parts) with your normal soap.             6.  Wash thoroughly, paying special attention to the area where your surgery  will be performed.  7.  Thoroughly rinse your body with warm water from the neck down.  8.  DO NOT shower/wash with your normal soap after using and rinsing off  the CHG Soap.                9.  Pat yourself dry with a clean towel.            10.  Wear clean pajamas.            11.  Place clean sheets on your bed the night of your first shower and do not  sleep with pets. Day of Surgery : Do not apply any lotions/deodorants the morning of surgery.  Please wear clean clothes to the hospital/surgery center.  FAILURE TO FOLLOW THESE INSTRUCTIONS MAY RESULT IN THE CANCELLATION OF YOUR SURGERY PATIENT SIGNATURE_________________________________  NURSE SIGNATURE__________________________________  ________________________________________________________________________

## 2021-02-27 ENCOUNTER — Encounter (HOSPITAL_COMMUNITY)
Admission: RE | Admit: 2021-02-27 | Discharge: 2021-02-27 | Disposition: A | Discharge status: Home / Self Care | Payer: Managed Care, Other (non HMO) | Source: Ambulatory Visit | Attending: Surgery | Admitting: Surgery

## 2021-02-27 ENCOUNTER — Other Ambulatory Visit: Payer: Self-pay

## 2021-02-27 ENCOUNTER — Encounter (HOSPITAL_COMMUNITY): Payer: Self-pay

## 2021-02-27 DIAGNOSIS — Z01818 Encounter for other preprocedural examination: Secondary | ICD-10-CM | POA: Insufficient documentation

## 2021-03-02 ENCOUNTER — Other Ambulatory Visit: Payer: Self-pay

## 2021-03-02 ENCOUNTER — Encounter (HOSPITAL_COMMUNITY)
Admission: RE | Admit: 2021-03-02 | Discharge: 2021-03-02 | Disposition: A | Discharge status: Home / Self Care | Payer: Managed Care, Other (non HMO) | Source: Ambulatory Visit | Attending: Surgery | Admitting: Surgery

## 2021-03-02 ENCOUNTER — Other Ambulatory Visit: Payer: Self-pay | Admitting: Surgery

## 2021-03-02 DIAGNOSIS — Z01818 Encounter for other preprocedural examination: Secondary | ICD-10-CM | POA: Insufficient documentation

## 2021-03-02 LAB — COMPREHENSIVE METABOLIC PANEL
ALT: 13 U/L (ref 0–44)
AST: 14 U/L — ABNORMAL LOW (ref 15–41)
Albumin: 4.1 g/dL (ref 3.5–5.0)
Alkaline Phosphatase: 64 U/L (ref 38–126)
Anion gap: 7 (ref 5–15)
BUN: 10 mg/dL (ref 6–20)
CO2: 25 mmol/L (ref 22–32)
Calcium: 9 mg/dL (ref 8.9–10.3)
Chloride: 107 mmol/L (ref 98–111)
Creatinine, Ser: 0.66 mg/dL (ref 0.44–1.00)
GFR, Estimated: >60 mL/min (ref >60)
Glucose, Bld: 92 mg/dL (ref 70–99)
Potassium: 3.9 mmol/L (ref 3.5–5.1)
Sodium: 139 mmol/L (ref 135–145)
Total Bilirubin: 0.6 mg/dL (ref 0.3–1.2)
Total Protein: 7.3 g/dL (ref 6.5–8.1)

## 2021-03-02 LAB — CBC WITH DIFFERENTIAL/PLATELET
Abs Immature Granulocytes: 0.04 10*3/uL (ref 0.00–0.07)
Basophils Absolute: 0.1 10*3/uL (ref 0.0–0.1)
Basophils Relative: 1 %
Eosinophils Absolute: 0.1 10*3/uL (ref 0.0–0.5)
Eosinophils Relative: 1 %
HCT: 42.9 % (ref 36.0–46.0)
Hemoglobin: 14.3 g/dL (ref 12.0–15.0)
Immature Granulocytes: 0 %
Lymphocytes Relative: 21 %
Lymphs Abs: 2.7 10*3/uL (ref 0.7–4.0)
MCH: 29.7 pg (ref 26.0–34.0)
MCHC: 33.3 g/dL (ref 30.0–36.0)
MCV: 89.2 fL (ref 80.0–100.0)
Monocytes Absolute: 0.6 10*3/uL (ref 0.1–1.0)
Monocytes Relative: 5 %
Neutro Abs: 9 10*3/uL — ABNORMAL HIGH (ref 1.7–7.7)
Neutrophils Relative %: 72 %
Platelets: 393 10*3/uL (ref 150–400)
RBC: 4.81 MIL/uL (ref 3.87–5.11)
RDW: 13.5 % (ref 11.5–15.5)
WBC: 12.5 10*3/uL — ABNORMAL HIGH (ref 4.0–10.5)
nRBC: 0 % (ref 0.0–0.2)

## 2021-03-02 LAB — HEMOGLOBIN A1C
Hgb A1c MFr Bld: 5.6 % (ref 4.8–5.6)
Mean Plasma Glucose: 114.02 mg/dL

## 2021-03-02 NOTE — Progress Notes (Addendum)
Cvoid trest- 03/02/2021.   Hgba1c- 12/19?22-5.6.

## 2021-03-02 NOTE — Progress Notes (Signed)
CBC /diff results of 03/02/2021 routed to DR [facility] and DR Cain Sieve.

## 2021-03-03 LAB — SARS CORONAVIRUS 2 (TAT 6-24 HRS): SARS Coronavirus 2: NEGATIVE

## 2021-03-03 NOTE — Anesthesia Preprocedure Evaluation (Addendum)
Anesthesia Evaluation  Patient identified by MRN, date of birth, ID band Patient awake    Reviewed: Allergy & Precautions, H&P , NPO status , Patient's Chart, lab work & pertinent test results  Airway Mallampati: II  TM Distance: >3 FB Neck ROM: Full    Dental no notable dental hx. (+) Teeth Intact, Dental Advisory Given   Pulmonary former smoker,    Pulmonary exam normal breath sounds clear to auscultation       Cardiovascular negative cardio ROS   Rhythm:Regular Rate:Normal     Neuro/Psych  Headaches, PSYCHIATRIC DISORDERS Anxiety Depression    GI/Hepatic Neg liver ROS, hiatal hernia,   Endo/Other  Morbid obesity  Renal/GU negative Renal ROS  negative genitourinary   Musculoskeletal   Abdominal   Peds  Hematology  (+) Blood dyscrasia, anemia ,   Anesthesia Other Findings   Reproductive/Obstetrics negative OB ROS                            Anesthesia Physical  Anesthesia Plan  ASA: 3  Anesthesia Plan: General   Post-op Pain Management: Ofirmev IV (intra-op) and Ketamine IV   Induction: Intravenous  PONV Risk Score and Plan: 1 and Ondansetron, Dexamethasone and Midazolam  Airway Management Planned: Oral ETT  Additional Equipment: None  Intra-op Plan:   Post-operative Plan: Extubation in OR  Informed Consent: I have reviewed the patients History and Physical, chart, labs and discussed the procedure including the risks, benefits and alternatives for the proposed anesthesia with the patient or authorized representative who has indicated his/her understanding and acceptance.     Dental advisory given  Plan Discussed with: CRNA and Anesthesiologist  Anesthesia Plan Comments: (  )        Anesthesia Quick Evaluation

## 2021-03-04 ENCOUNTER — Inpatient Hospital Stay (HOSPITAL_COMMUNITY): Payer: Managed Care, Other (non HMO) | Admitting: Anesthesiology

## 2021-03-04 ENCOUNTER — Encounter (HOSPITAL_COMMUNITY): Payer: Self-pay | Admitting: Surgery

## 2021-03-04 ENCOUNTER — Inpatient Hospital Stay (HOSPITAL_COMMUNITY): Payer: Managed Care, Other (non HMO) | Admitting: Physician Assistant

## 2021-03-04 ENCOUNTER — Encounter (HOSPITAL_COMMUNITY)
Admission: RE | Disposition: A | Discharge status: Home / Self Care | Payer: Self-pay | Source: Home / Self Care | Attending: Surgery

## 2021-03-04 ENCOUNTER — Inpatient Hospital Stay (HOSPITAL_COMMUNITY)
Admission: RE | Admit: 2021-03-04 | Discharge: 2021-03-06 | DRG: 331 | Disposition: A | Discharge status: Home / Self Care | Payer: Managed Care, Other (non HMO) | Attending: Surgery | Admitting: Surgery

## 2021-03-04 ENCOUNTER — Other Ambulatory Visit: Payer: Self-pay

## 2021-03-04 DIAGNOSIS — M549 Dorsalgia, unspecified: Secondary | ICD-10-CM | POA: Diagnosis present

## 2021-03-04 DIAGNOSIS — Z8371 Family history of colonic polyps: Secondary | ICD-10-CM | POA: Diagnosis not present

## 2021-03-04 DIAGNOSIS — Z800 Family history of malignant neoplasm of digestive organs: Secondary | ICD-10-CM

## 2021-03-04 DIAGNOSIS — Z833 Family history of diabetes mellitus: Secondary | ICD-10-CM

## 2021-03-04 DIAGNOSIS — Z888 Allergy status to other drugs, medicaments and biological substances status: Secondary | ICD-10-CM

## 2021-03-04 DIAGNOSIS — Z818 Family history of other mental and behavioral disorders: Secondary | ICD-10-CM | POA: Diagnosis not present

## 2021-03-04 DIAGNOSIS — G8929 Other chronic pain: Secondary | ICD-10-CM | POA: Diagnosis present

## 2021-03-04 DIAGNOSIS — Z87891 Personal history of nicotine dependence: Secondary | ICD-10-CM | POA: Diagnosis not present

## 2021-03-04 DIAGNOSIS — Z8249 Family history of ischemic heart disease and other diseases of the circulatory system: Secondary | ICD-10-CM

## 2021-03-04 DIAGNOSIS — Z01818 Encounter for other preprocedural examination: Secondary | ICD-10-CM

## 2021-03-04 DIAGNOSIS — Z9889 Other specified postprocedural states: Secondary | ICD-10-CM

## 2021-03-04 DIAGNOSIS — K660 Peritoneal adhesions (postprocedural) (postinfection): Secondary | ICD-10-CM | POA: Diagnosis present

## 2021-03-04 DIAGNOSIS — K5710 Diverticulosis of small intestine without perforation or abscess without bleeding: Secondary | ICD-10-CM | POA: Diagnosis present

## 2021-03-04 DIAGNOSIS — Z433 Encounter for attention to colostomy: Secondary | ICD-10-CM | POA: Diagnosis present

## 2021-03-04 DIAGNOSIS — Z83438 Family history of other disorder of lipoprotein metabolism and other lipidemia: Secondary | ICD-10-CM | POA: Diagnosis not present

## 2021-03-04 HISTORY — PX: FLEXIBLE SIGMOIDOSCOPY: SHX5431

## 2021-03-04 HISTORY — PX: LYSIS OF ADHESION: SHX5961

## 2021-03-04 HISTORY — PX: XI ROBOTIC ASSISTED COLOSTOMY TAKEDOWN: SHX6828

## 2021-03-04 LAB — TYPE AND SCREEN
ABO/RH(D): A POS
Antibody Screen: NEGATIVE

## 2021-03-04 LAB — PREGNANCY, URINE: Preg Test, Ur: NEGATIVE

## 2021-03-04 SURGERY — CLOSURE, COLOSTOMY, ROBOT-ASSISTED
Anesthesia: General | Site: Abdomen

## 2021-03-04 MED ORDER — HEPARIN SODIUM (PORCINE) 5000 UNIT/ML IJ SOLN
5000.0000 [IU] | Freq: Once | INTRAMUSCULAR | Status: AC
  Administered 2021-03-04: 07:00:00 5000 [IU] via SUBCUTANEOUS
  Filled 2021-03-04: qty 1

## 2021-03-04 MED ORDER — TRAMADOL HCL 50 MG PO TABS
50.0000 mg | ORAL_TABLET | Freq: Four times a day (QID) | ORAL | Status: DC | PRN
  Administered 2021-03-04 – 2021-03-05 (×4): 50 mg via ORAL
  Filled 2021-03-04 (×4): qty 1

## 2021-03-04 MED ORDER — STERILE WATER FOR INJECTION IJ SOLN
INTRAMUSCULAR | Status: DC | PRN
  Administered 2021-03-04: 10:00:00 20 mL

## 2021-03-04 MED ORDER — CHLORHEXIDINE GLUCONATE CLOTH 2 % EX PADS: 6.0000 | MEDICATED_PAD | Freq: Once | CUTANEOUS | Status: DC

## 2021-03-04 MED ORDER — LIDOCAINE 20MG/ML (2%) 15 ML SYRINGE OPTIME
INTRAMUSCULAR | Status: DC | PRN
  Administered 2021-03-04: 1.5 mg/kg/h via INTRAVENOUS

## 2021-03-04 MED ORDER — ALVIMOPAN 12 MG PO CAPS
12.0000 mg | ORAL_CAPSULE | Freq: Two times a day (BID) | ORAL | Status: DC
  Administered 2021-03-05 (×2): 12 mg via ORAL
  Filled 2021-03-04 (×2): qty 1

## 2021-03-04 MED ORDER — ACETAMINOPHEN 500 MG PO TABS
1000.0000 mg | ORAL_TABLET | Freq: Four times a day (QID) | ORAL | Status: DC
  Administered 2021-03-04 – 2021-03-06 (×7): 1000 mg via ORAL
  Filled 2021-03-04 (×8): qty 2

## 2021-03-04 MED ORDER — ACETAMINOPHEN 160 MG/5ML PO SOLN: 325.0000 mg | ORAL | Status: DC | PRN

## 2021-03-04 MED ORDER — BUPIVACAINE LIPOSOME 1.3 % IJ SUSP: 20.0000 mL | Freq: Once | INTRAMUSCULAR | Status: DC

## 2021-03-04 MED ORDER — SUGAMMADEX SODIUM 500 MG/5ML IV SOLN
INTRAVENOUS | Status: AC
  Filled 2021-03-04: qty 5

## 2021-03-04 MED ORDER — KETAMINE HCL 10 MG/ML IJ SOLN
INTRAMUSCULAR | Status: DC | PRN
  Administered 2021-03-04: 30 mg via INTRAVENOUS

## 2021-03-04 MED ORDER — KETAMINE HCL-SODIUM CHLORIDE 100-0.9 MG/10ML-% IV SOSY
PREFILLED_SYRINGE | INTRAVENOUS | Status: AC
  Filled 2021-03-04: qty 10

## 2021-03-04 MED ORDER — LIDOCAINE 2% (20 MG/ML) 5 ML SYRINGE
INTRAMUSCULAR | Status: DC | PRN
  Administered 2021-03-04: 60 mg via INTRAVENOUS

## 2021-03-04 MED ORDER — LIDOCAINE HCL 2 % IJ SOLN
INTRAMUSCULAR | Status: AC
  Filled 2021-03-04: qty 20

## 2021-03-04 MED ORDER — DEXMEDETOMIDINE (PRECEDEX) IN NS 20 MCG/5ML (4 MCG/ML) IV SYRINGE
PREFILLED_SYRINGE | INTRAVENOUS | Status: AC
  Filled 2021-03-04: qty 5

## 2021-03-04 MED ORDER — DEXAMETHASONE SODIUM PHOSPHATE 10 MG/ML IJ SOLN
INTRAMUSCULAR | Status: DC | PRN
Start: 2021-03-04 — End: 2021-03-04
  Administered 2021-03-04: 5 mg via INTRAVENOUS

## 2021-03-04 MED ORDER — 0.9 % SODIUM CHLORIDE (POUR BTL) OPTIME
TOPICAL | Status: DC | PRN
  Administered 2021-03-04: 10:00:00 2000 mL

## 2021-03-04 MED ORDER — DIPHENHYDRAMINE HCL 50 MG/ML IJ SOLN: 12.5000 mg | Freq: Four times a day (QID) | INTRAMUSCULAR | Status: DC | PRN

## 2021-03-04 MED ORDER — STERILE WATER FOR INJECTION IJ SOLN
INTRAMUSCULAR | Status: AC
  Filled 2021-03-04: qty 10

## 2021-03-04 MED ORDER — PHENYLEPHRINE 40 MCG/ML (10ML) SYRINGE FOR IV PUSH (FOR BLOOD PRESSURE SUPPORT)
PREFILLED_SYRINGE | INTRAVENOUS | Status: AC
  Filled 2021-03-04: qty 10

## 2021-03-04 MED ORDER — ONDANSETRON HCL 4 MG/2ML IJ SOLN: 4.0000 mg | Freq: Four times a day (QID) | INTRAMUSCULAR | Status: DC | PRN

## 2021-03-04 MED ORDER — NEOMYCIN SULFATE 500 MG PO TABS: 1000.0000 mg | ORAL_TABLET | ORAL | Status: DC

## 2021-03-04 MED ORDER — SUGAMMADEX SODIUM 500 MG/5ML IV SOLN
INTRAVENOUS | Status: DC | PRN
  Administered 2021-03-04: 300 mg via INTRAVENOUS

## 2021-03-04 MED ORDER — HYDROMORPHONE HCL 2 MG/ML IJ SOLN
INTRAMUSCULAR | Status: AC
  Filled 2021-03-04: qty 1

## 2021-03-04 MED ORDER — BUPIVACAINE LIPOSOME 1.3 % IJ SUSP
INTRAMUSCULAR | Status: AC
  Filled 2021-03-04: qty 20

## 2021-03-04 MED ORDER — SODIUM CHLORIDE 0.9 % IR SOLN
Status: DC | PRN
  Administered 2021-03-04: 1000 mL

## 2021-03-04 MED ORDER — ALUM & MAG HYDROXIDE-SIMETH 200-200-20 MG/5ML PO SUSP: 30.0000 mL | Freq: Four times a day (QID) | ORAL | Status: DC | PRN

## 2021-03-04 MED ORDER — CHLORHEXIDINE GLUCONATE 0.12 % MT SOLN
15.0000 mL | Freq: Once | OROMUCOSAL | Status: AC
  Administered 2021-03-04: 07:00:00 15 mL via OROMUCOSAL

## 2021-03-04 MED ORDER — LACTATED RINGERS IV SOLN: INTRAVENOUS | Status: DC

## 2021-03-04 MED ORDER — ONDANSETRON HCL 4 MG/2ML IJ SOLN
INTRAMUSCULAR | Status: AC
  Filled 2021-03-04: qty 2

## 2021-03-04 MED ORDER — ROCURONIUM BROMIDE 10 MG/ML (PF) SYRINGE
PREFILLED_SYRINGE | INTRAVENOUS | Status: AC
  Filled 2021-03-04: qty 10

## 2021-03-04 MED ORDER — OXYCODONE HCL 5 MG/5ML PO SOLN: 5.0000 mg | Freq: Once | ORAL | Status: DC | PRN

## 2021-03-04 MED ORDER — ROCURONIUM BROMIDE 10 MG/ML (PF) SYRINGE
PREFILLED_SYRINGE | INTRAVENOUS | Status: DC | PRN
Start: 2021-03-04 — End: 2021-03-04
  Administered 2021-03-04: 30 mg via INTRAVENOUS
  Administered 2021-03-04: 20 mg via INTRAVENOUS
  Administered 2021-03-04: 70 mg via INTRAVENOUS

## 2021-03-04 MED ORDER — BUPIVACAINE-EPINEPHRINE (PF) 0.25% -1:200000 IJ SOLN
INTRAMUSCULAR | Status: AC
  Filled 2021-03-04: qty 30

## 2021-03-04 MED ORDER — ONDANSETRON HCL 4 MG/2ML IJ SOLN
INTRAMUSCULAR | Status: DC | PRN
  Administered 2021-03-04: 4 mg via INTRAVENOUS

## 2021-03-04 MED ORDER — HYDRALAZINE HCL 20 MG/ML IJ SOLN: 10.0000 mg | INTRAMUSCULAR | Status: DC | PRN

## 2021-03-04 MED ORDER — LIDOCAINE 2% (20 MG/ML) 5 ML SYRINGE
INTRAMUSCULAR | Status: AC
  Filled 2021-03-04: qty 5

## 2021-03-04 MED ORDER — HYDROMORPHONE HCL 1 MG/ML IJ SOLN
INTRAMUSCULAR | Status: DC | PRN
  Administered 2021-03-04 (×2): .4 mg via INTRAVENOUS
  Administered 2021-03-04: .2 mg via INTRAVENOUS
  Administered 2021-03-04 (×2): .4 mg via INTRAVENOUS
  Administered 2021-03-04: .2 mg via INTRAVENOUS

## 2021-03-04 MED ORDER — METRONIDAZOLE 500 MG PO TABS: 1000.0000 mg | ORAL_TABLET | ORAL | Status: DC

## 2021-03-04 MED ORDER — INDOCYANINE GREEN 25 MG IV SOLR
INTRAVENOUS | Status: AC
  Filled 2021-03-04: qty 10

## 2021-03-04 MED ORDER — SODIUM CHLORIDE 0.9 % IV SOLN
2.0000 g | INTRAVENOUS | Status: AC
  Administered 2021-03-04: 09:00:00 2 g via INTRAVENOUS
  Filled 2021-03-04: qty 2

## 2021-03-04 MED ORDER — POLYETHYLENE GLYCOL 3350 17 GM/SCOOP PO POWD: 1.0000 | Freq: Once | ORAL | Status: DC

## 2021-03-04 MED ORDER — MEPERIDINE HCL 50 MG/ML IJ SOLN: 6.2500 mg | INTRAMUSCULAR | Status: DC | PRN

## 2021-03-04 MED ORDER — HEPARIN SODIUM (PORCINE) 5000 UNIT/ML IJ SOLN
5000.0000 [IU] | Freq: Three times a day (TID) | INTRAMUSCULAR | Status: DC
  Administered 2021-03-05 – 2021-03-06 (×4): 5000 [IU] via SUBCUTANEOUS
  Filled 2021-03-04 (×4): qty 1

## 2021-03-04 MED ORDER — EPHEDRINE SULFATE-NACL 50-0.9 MG/10ML-% IV SOSY
PREFILLED_SYRINGE | INTRAVENOUS | Status: DC | PRN
  Administered 2021-03-04: 10 mg via INTRAVENOUS

## 2021-03-04 MED ORDER — ACETAMINOPHEN 500 MG PO TABS
1000.0000 mg | ORAL_TABLET | ORAL | Status: AC
  Administered 2021-03-04: 07:00:00 1000 mg via ORAL
  Filled 2021-03-04: qty 2

## 2021-03-04 MED ORDER — SIMETHICONE 80 MG PO CHEW: 40.0000 mg | CHEWABLE_TABLET | Freq: Four times a day (QID) | ORAL | Status: DC | PRN

## 2021-03-04 MED ORDER — ENSURE PRE-SURGERY PO LIQD: 592.0000 mL | Freq: Once | ORAL | Status: DC

## 2021-03-04 MED ORDER — BUPIVACAINE LIPOSOME 1.3 % IJ SUSP
INTRAMUSCULAR | Status: DC | PRN
  Administered 2021-03-04: 20 mL

## 2021-03-04 MED ORDER — ENSURE SURGERY PO LIQD
237.0000 mL | Freq: Two times a day (BID) | ORAL | Status: DC
  Administered 2021-03-04 – 2021-03-06 (×4): 237 mL via ORAL

## 2021-03-04 MED ORDER — PHENYLEPHRINE 40 MCG/ML (10ML) SYRINGE FOR IV PUSH (FOR BLOOD PRESSURE SUPPORT)
PREFILLED_SYRINGE | INTRAVENOUS | Status: DC | PRN
  Administered 2021-03-04 (×2): 80 ug via INTRAVENOUS

## 2021-03-04 MED ORDER — DEXAMETHASONE SODIUM PHOSPHATE 10 MG/ML IJ SOLN
INTRAMUSCULAR | Status: AC
  Filled 2021-03-04: qty 1

## 2021-03-04 MED ORDER — OXYCODONE HCL 5 MG PO TABS: 5.0000 mg | ORAL_TABLET | Freq: Once | ORAL | Status: DC | PRN

## 2021-03-04 MED ORDER — FENTANYL CITRATE PF 50 MCG/ML IJ SOSY
PREFILLED_SYRINGE | INTRAMUSCULAR | Status: AC
  Administered 2021-03-04: 13:00:00 50 ug via INTRAVENOUS
  Filled 2021-03-04: qty 3

## 2021-03-04 MED ORDER — SODIUM CHLORIDE (PF) 0.9 % IJ SOLN
INTRAMUSCULAR | Status: AC
  Filled 2021-03-04: qty 10

## 2021-03-04 MED ORDER — ONDANSETRON HCL 4 MG PO TABS: 4.0000 mg | ORAL_TABLET | Freq: Four times a day (QID) | ORAL | Status: DC | PRN

## 2021-03-04 MED ORDER — DEXMEDETOMIDINE (PRECEDEX) IN NS 20 MCG/5ML (4 MCG/ML) IV SYRINGE
PREFILLED_SYRINGE | INTRAVENOUS | Status: DC | PRN
  Administered 2021-03-04: 8 ug via INTRAVENOUS
  Administered 2021-03-04: 4 ug via INTRAVENOUS

## 2021-03-04 MED ORDER — SODIUM CHLORIDE (PF) 0.9 % IJ SOLN
INTRAMUSCULAR | Status: AC
  Filled 2021-03-04: qty 20

## 2021-03-04 MED ORDER — ORAL CARE MOUTH RINSE: 15.0000 mL | Freq: Once | OROMUCOSAL | Status: AC

## 2021-03-04 MED ORDER — INDOCYANINE GREEN 25 MG IV SOLR
INTRAVENOUS | Status: DC | PRN
  Administered 2021-03-04: 20 mg

## 2021-03-04 MED ORDER — IBUPROFEN 400 MG PO TABS
600.0000 mg | ORAL_TABLET | Freq: Four times a day (QID) | ORAL | Status: DC | PRN
  Administered 2021-03-04: 21:00:00 600 mg via ORAL
  Filled 2021-03-04: qty 1

## 2021-03-04 MED ORDER — MIDAZOLAM HCL 2 MG/2ML IJ SOLN
INTRAMUSCULAR | Status: DC | PRN
  Administered 2021-03-04: 2 mg via INTRAVENOUS

## 2021-03-04 MED ORDER — FENTANYL CITRATE (PF) 250 MCG/5ML IJ SOLN
INTRAMUSCULAR | Status: AC
  Filled 2021-03-04: qty 5

## 2021-03-04 MED ORDER — FENTANYL CITRATE PF 50 MCG/ML IJ SOSY
25.0000 ug | PREFILLED_SYRINGE | INTRAMUSCULAR | Status: DC | PRN
  Administered 2021-03-04 (×2): 50 ug via INTRAVENOUS

## 2021-03-04 MED ORDER — ACETAMINOPHEN 325 MG PO TABS: 325.0000 mg | ORAL_TABLET | ORAL | Status: DC | PRN

## 2021-03-04 MED ORDER — DIPHENHYDRAMINE HCL 12.5 MG/5ML PO ELIX: 12.5000 mg | ORAL_SOLUTION | Freq: Four times a day (QID) | ORAL | Status: DC | PRN

## 2021-03-04 MED ORDER — PROPOFOL 10 MG/ML IV BOLUS
INTRAVENOUS | Status: DC | PRN
  Administered 2021-03-04: 150 mg via INTRAVENOUS

## 2021-03-04 MED ORDER — HYDROMORPHONE HCL 1 MG/ML IJ SOLN
0.5000 mg | INTRAMUSCULAR | Status: DC | PRN
  Administered 2021-03-04 – 2021-03-06 (×5): 0.5 mg via INTRAVENOUS
  Filled 2021-03-04 (×5): qty 0.5

## 2021-03-04 MED ORDER — BISACODYL 5 MG PO TBEC: 20.0000 mg | DELAYED_RELEASE_TABLET | Freq: Once | ORAL | Status: DC

## 2021-03-04 MED ORDER — LACTATED RINGERS IV SOLN: INTRAVENOUS | Status: DC | PRN

## 2021-03-04 MED ORDER — FENTANYL CITRATE (PF) 250 MCG/5ML IJ SOLN
INTRAMUSCULAR | Status: DC | PRN
  Administered 2021-03-04 (×2): 100 ug via INTRAVENOUS
  Administered 2021-03-04: 50 ug via INTRAVENOUS

## 2021-03-04 MED ORDER — ONDANSETRON HCL 4 MG/2ML IJ SOLN: 4.0000 mg | Freq: Once | INTRAMUSCULAR | Status: DC | PRN

## 2021-03-04 MED ORDER — PROPOFOL 10 MG/ML IV BOLUS
INTRAVENOUS | Status: AC
  Filled 2021-03-04: qty 20

## 2021-03-04 MED ORDER — EPHEDRINE 5 MG/ML INJ
INTRAVENOUS | Status: AC
  Filled 2021-03-04: qty 5

## 2021-03-04 MED ORDER — ENSURE PRE-SURGERY PO LIQD: 296.0000 mL | Freq: Once | ORAL | Status: DC

## 2021-03-04 MED ORDER — ALVIMOPAN 12 MG PO CAPS
12.0000 mg | ORAL_CAPSULE | ORAL | Status: AC
  Administered 2021-03-04: 07:00:00 12 mg via ORAL
  Filled 2021-03-04: qty 1

## 2021-03-04 MED ORDER — MIDAZOLAM HCL 2 MG/2ML IJ SOLN
INTRAMUSCULAR | Status: AC
  Filled 2021-03-04: qty 2

## 2021-03-04 SURGICAL SUPPLY — 136 items
ADAPTER GOLDBERG URETERAL (ADAPTER) IMPLANT
APPLIER CLIP 5 13 M/L LIGAMAX5 (MISCELLANEOUS)
APPLIER CLIP ROT 10 11.4 M/L (STAPLE)
BAG COUNTER SPONGE SURGICOUNT (BAG) IMPLANT
BAG SURGICOUNT SPONGE COUNTING (BAG)
BAG URO CATCHER STRL LF (MISCELLANEOUS) ×4 IMPLANT
BLADE EXTENDED COATED 6.5IN (ELECTRODE) ×4 IMPLANT
CANNULA REDUC XI 12-8 STAPL (CANNULA) ×1
CANNULA REDUC XI 12-8MM STAPL (CANNULA) ×1
CANNULA REDUCER 12-8 DVNC XI (CANNULA) ×2 IMPLANT
CATH URETL OPEN 5X70 (CATHETERS) ×2 IMPLANT
CELLS DAT CNTRL 66122 CELL SVR (MISCELLANEOUS) IMPLANT
CHLORAPREP W/TINT 26 (MISCELLANEOUS) ×4 IMPLANT
CLIP APPLIE 5 13 M/L LIGAMAX5 (MISCELLANEOUS) IMPLANT
CLIP APPLIE ROT 10 11.4 M/L (STAPLE) IMPLANT
CLIP LIGATING HEM O LOK PURPLE (MISCELLANEOUS) IMPLANT
CLIP LIGATING HEMO O LOK GREEN (MISCELLANEOUS) IMPLANT
CLOTH BEACON ORANGE TIMEOUT ST (SAFETY) ×4 IMPLANT
COVER SURGICAL LIGHT HANDLE (MISCELLANEOUS) ×8 IMPLANT
COVER TIP SHEARS 8 DVNC (MISCELLANEOUS) ×2 IMPLANT
COVER TIP SHEARS 8MM DA VINCI (MISCELLANEOUS) ×2
DECANTER SPIKE VIAL GLASS SM (MISCELLANEOUS) ×4 IMPLANT
DEFOGGER SCOPE WARMER CLEARIFY (MISCELLANEOUS) ×4 IMPLANT
DERMABOND ADVANCED (GAUZE/BANDAGES/DRESSINGS) ×2
DERMABOND ADVANCED .7 DNX12 (GAUZE/BANDAGES/DRESSINGS) IMPLANT
DEVICE TROCAR PUNCTURE CLOSURE (ENDOMECHANICALS) IMPLANT
DRAIN CHANNEL 19F RND (DRAIN) ×4 IMPLANT
DRAPE ARM DVNC X/XI (DISPOSABLE) ×8 IMPLANT
DRAPE COLUMN DVNC XI (DISPOSABLE) ×2 IMPLANT
DRAPE DA VINCI XI ARM (DISPOSABLE) ×8
DRAPE DA VINCI XI COLUMN (DISPOSABLE) ×2
DRAPE SURG IRRIG POUCH 19X23 (DRAPES) ×4 IMPLANT
DRSG OPSITE POSTOP 4X10 (GAUZE/BANDAGES/DRESSINGS) IMPLANT
DRSG OPSITE POSTOP 4X6 (GAUZE/BANDAGES/DRESSINGS) IMPLANT
DRSG OPSITE POSTOP 4X8 (GAUZE/BANDAGES/DRESSINGS) IMPLANT
DRSG TEGADERM 2-3/8X2-3/4 SM (GAUZE/BANDAGES/DRESSINGS) ×20 IMPLANT
DRSG TEGADERM 4X4.75 (GAUZE/BANDAGES/DRESSINGS) ×4 IMPLANT
ELECT REM PT RETURN 15FT ADLT (MISCELLANEOUS) ×4 IMPLANT
ENDOLOOP SUT PDS II  0 18 (SUTURE)
ENDOLOOP SUT PDS II 0 18 (SUTURE) IMPLANT
EVACUATOR SILICONE 100CC (DRAIN) ×4 IMPLANT
GAUZE SPONGE 2X2 8PLY STRL LF (GAUZE/BANDAGES/DRESSINGS) ×2 IMPLANT
GAUZE SPONGE 4X4 12PLY STRL (GAUZE/BANDAGES/DRESSINGS) ×4 IMPLANT
GLOVE SURG ENC MOIS LTX SZ7.5 (GLOVE) ×12 IMPLANT
GLOVE SURG ENC TEXT LTX SZ7.5 (GLOVE) ×4 IMPLANT
GLOVE SURG UNDER LTX SZ8 (GLOVE) ×12 IMPLANT
GOWN STRL REUS W/TWL LRG LVL3 (GOWN DISPOSABLE) ×4 IMPLANT
GOWN STRL REUS W/TWL XL LVL3 (GOWN DISPOSABLE) ×24 IMPLANT
GRASPER SUT TROCAR 14GX15 (MISCELLANEOUS) IMPLANT
GUIDEWIRE ANG ZIPWIRE 038X150 (WIRE) IMPLANT
GUIDEWIRE STR DUAL SENSOR (WIRE) ×2 IMPLANT
HOLDER FOLEY CATH W/STRAP (MISCELLANEOUS) ×4 IMPLANT
IRRIG SUCT STRYKERFLOW 2 WTIP (MISCELLANEOUS) ×4
IRRIGATION SUCT STRKRFLW 2 WTP (MISCELLANEOUS) ×2 IMPLANT
KIT PROCEDURE DA VINCI SI (MISCELLANEOUS) ×2
KIT PROCEDURE DVNC SI (MISCELLANEOUS) ×2 IMPLANT
KIT TURNOVER KIT A (KITS) IMPLANT
MANIFOLD NEPTUNE II (INSTRUMENTS) ×4 IMPLANT
NDL INSUFFLATION 14GA 120MM (NEEDLE) ×2 IMPLANT
NDL SPNL 22GX3.5 QUINCKE BK (NEEDLE) IMPLANT
NEEDLE INSUFFLATION 14GA 120MM (NEEDLE) ×4 IMPLANT
NEEDLE SPNL 22GX3.5 QUINCKE BK (NEEDLE) ×4 IMPLANT
PACK CARDIOVASCULAR III (CUSTOM PROCEDURE TRAY) ×4 IMPLANT
PACK COLON (CUSTOM PROCEDURE TRAY) ×4 IMPLANT
PACK CYSTO (CUSTOM PROCEDURE TRAY) ×4 IMPLANT
PAD POSITIONING PINK XL (MISCELLANEOUS) ×4 IMPLANT
PENCIL SMOKE EVACUATOR (MISCELLANEOUS) IMPLANT
PROTECTOR NERVE ULNAR (MISCELLANEOUS) ×8 IMPLANT
RELOAD STAPLE 45 3.5 BLU DVNC (STAPLE) IMPLANT
RELOAD STAPLE 45 4.3 GRN DVNC (STAPLE) IMPLANT
RELOAD STAPLE 60 3.5 BLU DVNC (STAPLE) IMPLANT
RELOAD STAPLE 60 4.1 GRN THCK (STAPLE) IMPLANT
RELOAD STAPLE 60 4.3 GRN DVNC (STAPLE) IMPLANT
RELOAD STAPLER 3.5X45 BLU DVNC (STAPLE) IMPLANT
RELOAD STAPLER 3.5X60 BLU DVNC (STAPLE) IMPLANT
RELOAD STAPLER 4.3X45 GRN DVNC (STAPLE) IMPLANT
RELOAD STAPLER 4.3X60 GRN DVNC (STAPLE) IMPLANT
RELOAD STAPLER GREEN 60MM (STAPLE) ×2 IMPLANT
RETRACTOR WND ALEXIS 18 MED (MISCELLANEOUS) IMPLANT
RTRCTR WOUND ALEXIS 18CM MED (MISCELLANEOUS)
SCISSORS LAP 5X35 DISP (ENDOMECHANICALS) IMPLANT
SEAL CANN UNIV 5-8 DVNC XI (MISCELLANEOUS) ×8 IMPLANT
SEAL XI 5MM-8MM UNIVERSAL (MISCELLANEOUS) ×8
SEALER VESSEL DA VINCI XI (MISCELLANEOUS) ×2
SEALER VESSEL EXT DVNC XI (MISCELLANEOUS) ×2 IMPLANT
SLEEVE ADV FIXATION 5X100MM (TROCAR) IMPLANT
SOLUTION ELECTROLUBE (MISCELLANEOUS) ×4 IMPLANT
SPONGE GAUZE 2X2 STER 10/PKG (GAUZE/BANDAGES/DRESSINGS) ×2
STAPLER CANNULA SEAL DVNC XI (STAPLE) ×2 IMPLANT
STAPLER CANNULA SEAL XI (STAPLE) ×2
STAPLER ECHELON LONG 60 440 (INSTRUMENTS) ×2 IMPLANT
STAPLER ECHELON POWER CIR 29 (STAPLE) ×2 IMPLANT
STAPLER ECHELON POWER CIR 31 (STAPLE) IMPLANT
STAPLER RELOAD 3.5X45 BLU DVNC (STAPLE)
STAPLER RELOAD 3.5X45 BLUE (STAPLE)
STAPLER RELOAD 3.5X60 BLU DVNC (STAPLE)
STAPLER RELOAD 3.5X60 BLUE (STAPLE)
STAPLER RELOAD 4.3X45 GREEN (STAPLE)
STAPLER RELOAD 4.3X45 GRN DVNC (STAPLE)
STAPLER RELOAD 4.3X60 GREEN (STAPLE)
STAPLER RELOAD 4.3X60 GRN DVNC (STAPLE)
STAPLER RELOAD GREEN 60MM (STAPLE) ×4
STOPCOCK 4 WAY LG BORE MALE ST (IV SETS) ×8 IMPLANT
SURGILUBE 2OZ TUBE FLIPTOP (MISCELLANEOUS) ×4 IMPLANT
SUT MNCRL AB 4-0 PS2 18 (SUTURE) ×4 IMPLANT
SUT PDS AB 1 CT1 27 (SUTURE) IMPLANT
SUT PDS AB 1 TP1 96 (SUTURE) IMPLANT
SUT PROLENE 0 CT 2 (SUTURE) IMPLANT
SUT PROLENE 2 0 KS (SUTURE) ×4 IMPLANT
SUT PROLENE 2 0 SH DA (SUTURE) IMPLANT
SUT SILK 2 0 (SUTURE)
SUT SILK 2 0 SH CR/8 (SUTURE) IMPLANT
SUT SILK 2-0 18XBRD TIE 12 (SUTURE) IMPLANT
SUT SILK 3 0 (SUTURE) ×2
SUT SILK 3 0 SH CR/8 (SUTURE) ×4 IMPLANT
SUT SILK 3-0 18XBRD TIE 12 (SUTURE) ×2 IMPLANT
SUT V-LOC BARB 180 2/0GR6 GS22 (SUTURE)
SUT VIC AB 3-0 SH 18 (SUTURE) IMPLANT
SUT VIC AB 3-0 SH 27 (SUTURE)
SUT VIC AB 3-0 SH 27XBRD (SUTURE) IMPLANT
SUT VICRYL 0 UR6 27IN ABS (SUTURE) ×4 IMPLANT
SUTURE V-LC BRB 180 2/0GR6GS22 (SUTURE) IMPLANT
SYR 10ML LL (SYRINGE) ×4 IMPLANT
SYS LAPSCP GELPORT 120MM (MISCELLANEOUS)
SYS WOUND ALEXIS 18CM MED (MISCELLANEOUS) ×4
SYSTEM LAPSCP GELPORT 120MM (MISCELLANEOUS) IMPLANT
SYSTEM WOUND ALEXIS 18CM MED (MISCELLANEOUS) ×2 IMPLANT
TAPE CLOTH SURG 4X10 WHT LF (GAUZE/BANDAGES/DRESSINGS) ×2 IMPLANT
TAPE UMBILICAL 1/8 X36 TWILL (MISCELLANEOUS) ×4 IMPLANT
TOWEL OR NON WOVEN STRL DISP B (DISPOSABLE) ×4 IMPLANT
TRAY FOLEY MTR SLVR 16FR STAT (SET/KITS/TRAYS/PACK) ×4 IMPLANT
TROCAR ADV FIXATION 5X100MM (TROCAR) ×4 IMPLANT
TUBING CONNECTING 10 (TUBING) ×10 IMPLANT
TUBING CONNECTING 10' (TUBING) ×4
TUBING INSUFFLATION 10FT LAP (TUBING) ×4 IMPLANT
TUBING UROLOGY SET (TUBING) IMPLANT

## 2021-03-04 NOTE — Op Note (Signed)
Preoperative diagnosis:  1. Ureteral identification for pelvic surgery 2. Refractory sigmoid diverticulosis 3. S/p colostomy   Postoperative diagnosis: 1. same  Procedure(s): 1. Cystoscopy with bilateral ureteral catheterization for firefly injection  Surgeon: Dr. Donald Pore  Anesthesia: general  Complications: none  EBL: <1 cc  Intraoperative findings: unremarkable cysto  Indication: identification of ureters for abdominal/pelvic surgery  Description of procedure:   With appropriate consent having been obtained, the patient was brought to the operative suite. Anesthesia was induced and the patient was prepped and draped in the usual sterile fashion. A timeout was performed  Thereafter the rigid cystoscope was carefully inserted into the bladder. The bladder was inspected thoroughly and no abnormalities were identified.   The right ureteral orifice was identified and cannulated with a 6 fr open ended ureteral catheter. It was easily advanced to the level of the collecting system. The ureteral catheter was then slowly withdrawn while simultaneously injecting the firefly solution over the length of the ureter. Approximately 7.5 cc total were injected.  The procedure was then repeated in an identical fashion on the left.   A 16 fr foley was then placed in a sterile fashion with immediate return of clear blue/green urine, and the balloon was inflated with 10 cc sterile water.   Donald Pore MD 03/04/2021, 9:49 AM  [facility]  Pager: [phone]

## 2021-03-04 NOTE — Transfer of Care (Signed)
Immediate Anesthesia Transfer of Care Note  Patient: Kristin Sawyer  Procedure(s) Performed: XI ROBOTIC ASSISTED COLOSTOMY TAKEDOWN with low anterior resection, transabdominal plane block (Abdomen) LYSIS OF ADHESION FLEXIBLE SIGMOIDOSCOPY CYSTOSCOPY with FIREFLY INJECTION  Patient Location: PACU  Anesthesia Type:General  Level of Consciousness: awake, alert  and patient cooperative  Airway & Oxygen Therapy: Patient Spontanous Breathing and Patient connected to face mask oxygen  Post-op Assessment: Report given to RN and Post -op Vital signs reviewed and stable  Post vital signs: Reviewed and stable  Last Vitals:  Vitals Value Taken Time  BP 109/85 03/04/21 1202  Temp    Pulse 104 03/04/21 1204  Resp 22 03/04/21 1204  SpO2 100 % 03/04/21 1204  Vitals shown include unvalidated device data.  Last Pain:  Vitals:   03/04/21 0650  TempSrc: Oral  PainSc:          Complications: No notable events documented.

## 2021-03-04 NOTE — Anesthesia Postprocedure Evaluation (Signed)
Anesthesia Post Note  Patient: Armed forces technical officer  Procedure(s) Performed: XI ROBOTIC ASSISTED COLOSTOMY TAKEDOWN with low anterior resection, transabdominal plane block (Abdomen) LYSIS OF ADHESION FLEXIBLE SIGMOIDOSCOPY CYSTOSCOPY with FIREFLY INJECTION     Patient location during evaluation: PACU Anesthesia Type: General Level of consciousness: awake and alert Pain management: pain level controlled Vital Signs Assessment: post-procedure vital signs reviewed and stable Respiratory status: spontaneous breathing, nonlabored ventilation, respiratory function stable and patient connected to nasal cannula oxygen Cardiovascular status: blood pressure returned to baseline and stable Postop Assessment: no apparent nausea or vomiting Anesthetic complications: no   No notable events documented.  Last Vitals:  Vitals:   03/04/21 0650 03/04/21 1202  BP: (!) 105/59   Pulse: 77   Resp: 16   Temp: 36.9 C 36.5 C  SpO2: 100%     Last Pain:  Vitals:   03/04/21 1202  TempSrc:   PainSc: 0-No pain                 Cabrini Ruggieri

## 2021-03-04 NOTE — H&P (Signed)
Chief Complaint:Here for surgery  History of Present Illness: Kristin Sawyer is a 40 y.o. female with history of tobacco use, who has been referred to see me for possible colostomy reversal discussions.  She underwent exlap with sigmoidectomy, JP placement and end colostomy for refractory sigmoid diverticulitis 07/17/20. She was admitted postoperatively and recovered reasonably well. She was discharged home 07/25/2020.  Pathology returned with diverticulosis and transmural defect. 4 benign lymph nodes.  GGE 09/23/20 -satisfactory appearance of her rectal stump. Colonoscopy with Dr. Hilarie Fredrickson 12/09/2020 demonstrated stricturing at her end colostomy at the level of the skin. He was able to ultimately dilate this and traverse the stricture. She was found to have mild mucosal changes at the rectosigmoid colon secondary to diversion colitis. No polyps in the rectal stump. Small internal hemorrhoids. Patent colostomy after dilation. 1 5 mm polyp removed from the cecum.  She reports that she has had a relatively uneventful recovery from her surgery. She denies any new health issues. She does report that 6 weeks after her surgery she resumed smoking. We discussed that she needs to quit today. She has had some issues with skin stenosis at her colostomy site where it is flush/retracted. She takes MiraLAX daily. Her colostomy has otherwise been working well. She denies fever/chills/nausea/vomiting or abdominal pain.  She reports she quit all tobacco products 4 weeks ago. No n/v. Reports colostomy has been working well since [place] opened the stenosis. Tolerated prep with satisfactory result.  PMH: Tobacco use  PSH: Exlap/Hartmann's 07/17/20 - otherwise denies any prior abdominal/pelvic surgical hx  FHx: Denies any known family history of colorectal, breast, endometrial or ovarian cancer  Social Hx: Denies use of EtOH/illicit drug. Smokes 1 pack/day.   Past Medical History:  Diagnosis Date   B12 nutritional  deficiency    Colonic diverticular abscess    Condyloma    Diverticulitis    Hiatal hernia    History of marijuana use    pre-pregnancy   History of trichomoniasis 03/15/2013   HPV in female    Hx of abnormal cervical Papanicolaou smear    Hx of adenoidectomy    Hx of tonsillectomy    Ovarian cyst    Pulmonary nodule     Past Surgical History:  Procedure Laterality Date   COLONOSCOPY WITH PROPOFOL N/A 12/09/2020   Procedure: COLONOSCOPY WITH PROPOFOL;  Surgeon: Jerene Bears, MD;  Location: [facility];  Service: Gastroenterology;  Laterality: N/A;   COLOSTOMY     CYSTOSCOPY WITH STENT PLACEMENT Left 07/17/2020   Procedure: CYSTOSCOPY WITH STENT PLACEMENT;  Surgeon: Raynelle Bring, MD;  Location: [facility];  Service: Urology;  Laterality: Left;   EXPLORATORY LAPAROTOMY  07/17/2020   with segmental colectomy   PARTIAL COLECTOMY N/A 07/17/2020   Procedure: PARTIAL COLECTOMY;  Surgeon: Jesusita Oka, MD;  Location: [facility];  Service: General;  Laterality: N/A;   POLYPECTOMY  12/09/2020   Procedure: POLYPECTOMY;  Surgeon: Jerene Bears, MD;  Location: [facility];  Service: Gastroenterology;;   POLYPECTOMY     TONSILLECTOMY      Family History  Problem Relation Age of Onset   Diabetes Mother    Irritable bowel syndrome Mother    Colon polyps Mother 12   Diabetes Father    Heart disease Father    Hyperlipidemia Father    Hypertension Father    Depression Sister    Pancreatic cancer Maternal Aunt    Cancer Maternal Aunt    Alcohol abuse Maternal Uncle  Diabetes Maternal Grandmother     Social:  reports that she quit smoking about 2 weeks ago. Her smoking use included cigarettes. She smoked an average of 1 pack per day. She has never used smokeless tobacco. She reports that she does not currently use alcohol. She reports that she does not use drugs.  Allergies:  Allergies  Allergen Reactions   Other Other (See Comments)    Rondec DM  Reaction: causes hallucinations     Medications: I have reviewed the patient's current medications.  Results for orders placed or performed during the hospital encounter of 03/04/21 (from the past 48 hour(s))  Pregnancy, urine     Status: None   Collection Time: 03/04/21  6:23 AM  Result Value Ref Range   Preg Test, Ur NEGATIVE NEGATIVE    Comment:        THE SENSITIVITY OF THIS METHODOLOGY IS >20 mIU/mL. Performed at [facility], [place] [address] [place], [place] [address]     No results found.  ROS - all of the below systems have been reviewed with the patient and positives are indicated with bold text General: chills, fever or night sweats Eyes: blurry vision or double vision ENT: epistaxis or sore throat Allergy/Immunology: itchy/watery eyes or nasal congestion Hematologic/Lymphatic: bleeding problems, blood clots or swollen lymph nodes Endocrine: temperature intolerance or unexpected weight changes Breast: new or changing breast lumps or nipple discharge Resp: cough, shortness of breath, or wheezing CV: chest pain or dyspnea on exertion GI: as per HPI GU: dysuria, trouble voiding, or hematuria MSK: joint pain or joint stiffness Neuro: TIA or stroke symptoms Derm: pruritus and skin lesion changes Psych: anxiety and depression  PE Blood pressure (!) 105/59, pulse 77, temperature 98.5 F (36.9 C), temperature source Oral, resp. rate 16, height 5\' 3"  (1.6 m), weight 121.1 kg, last menstrual period 02/16/2021, SpO2 100 %, unknown if currently breastfeeding. Constitutional: NAD; conversant Eyes: Moist conjunctiva; no lid lag Neck: Trachea midline; no thyromegaly Lungs: Normal respiratory effort; no tactile fremitus CV: RRR; no palpable thrills; no pitting edema GI: Abd soft, NT/ND MSK: Normal range of motion of extremities Psychiatric: Appropriate affect; alert and oriented x3  Results for orders placed or performed during the hospital encounter of 03/04/21 (from the past 48 hour(s))   Pregnancy, urine     Status: None   Collection Time: 03/04/21  6:23 AM  Result Value Ref Range   Preg Test, Ur NEGATIVE NEGATIVE    Comment:        THE SENSITIVITY OF THIS METHODOLOGY IS >20 mIU/mL. Performed at [facility], [place] [address], [place], [place] [address]     No results found.  A/P: Kristin Sawyer is an 40 y.o. female with hx of tobacco use here for evaluation of colostomy status, highly interested in colostomy takedown.  -On her CT scan, she does have a remnant segment of sigmoid overlying her uterus. The stump looks well away from her left ureter. -The anatomy and physiology of the GI tract was reviewed with the patient as it pertains to her colostomy status. -We have discussed various different treatment options going forward -covering that this is a "elective" procedure in the sense that she is currently doing reasonably well and will be subjected to risks of major abdominal surgery. -We spent time today reviewing the technical aspects of the procedure- robotic assisted colostomy takedown, possible segmental colectomy, possible laparotomy, flexible sigmoidoscopy, intraoperative assessment perfusion; [facility] for cystoscopy and ureteral firefly/ICG. -The  planned procedures, material risks (including, but not limited to, pain, bleeding, infection, scarring, need for blood transfusion, damage to surrounding structures- blood vessels/nerves/viscus/organs, damage to ureter, urine leak, leak from anastomosis, need for additional procedures, scenarios where a stoma may be necessary and where it may be permanent, worsening of pre-existing medical conditions, hernia, recurrence, pneumonia, heart attack, stroke, death) benefits and alternatives to surgery were discussed at length. The patient's questions were answered to her satisfaction, she voiced understanding and elected to proceed with surgery. Additionally, we discussed typical postoperative  expectations and the recovery process. -She states she would be able to quit smoking today if she needs to. We discussed the importance of cessation from all nicotine containing products including high risks of anastomotic leak. We discussed high risks of hernia and infectious complications in patients whom are using nicotine containing products. She expressed understanding. She states she will quit smoking today and remain free from all nicotine containing products following her surgery as well.  Nadeen Landau, MD [facility], [facility]

## 2021-03-04 NOTE — Anesthesia Procedure Notes (Signed)
Procedure Name: Intubation Date/Time: 03/04/2021 8:42 AM Performed by: Lollie Sails, CRNA Pre-anesthesia Checklist: Patient identified, Emergency Drugs available, Suction available, Patient being monitored and Timeout performed Patient Re-evaluated:Patient Re-evaluated prior to induction Oxygen Delivery Method: Circle system utilized Preoxygenation: Pre-oxygenation with 100% oxygen Induction Type: IV induction Ventilation: Mask ventilation without difficulty Laryngoscope Size: Miller and 3 Grade View: Grade I Tube type: Oral Tube size: 7.5 mm Number of attempts: 1 Airway Equipment and Method: Stylet Placement Confirmation: ETT inserted through vocal cords under direct vision, positive ETCO2 and breath sounds checked- equal and bilateral Secured at: 23 cm Tube secured with: Tape Dental Injury: Teeth and Oropharynx as per pre-operative assessment

## 2021-03-04 NOTE — Op Note (Signed)
PATIENT: Kristin Sawyer  40 y.o. female  Patient Care Team: Lavada Mesi as PCP - General (Family Medicine)  PREOP DIAGNOSIS: colostomy status  POSTOP DIAGNOSIS: colostomy status  PROCEDURE:  Robotic assisted low anterior resection with stapled colorectal anastomosis Robotic lysis of adhesions x 90 minutes Flexible sigmoidoscopy Bilateral transversus abdominus plane (TAP) blocks  SURGEON: Sharon Mt. Lolly Glaus, MD  ASSISTANT: Leighton Ruff MD  ANESTHESIA: General endotracheal  EBL: 50 mL Total I/O In: 1100 [I.V.:1000; IV Piggyback:100] Out: 200 [Urine:100; Blood:100]  DRAINS: None  SPECIMEN:  Rectosigmoid colon Colostomy - stitch at skin junction  COUNTS: Sponge, needle and instrument counts were reported correct x2  FINDINGS: Intraabdominal adhesions consistent with prior laparotomy - included small bowel, omentum, small bowel mesentery, colon. This was all lysed sharply without any apparent injury. Remnant sigmoid in situ. A low anterior resection was carried out with a double stapled colorectal anastomosis to healthy/fresh rectum. A well perfused, tension free, hemostatic, air tight 29 mm EEA colorectal anastomosis fashioned 15 cm from the anal verge by flexible sigmoidoscopy.   NARRATIVE: Informed consent was verified. The patient was taken to the operating room, placed supine on the operating table and SCD's were applied. General endotracheal anesthesia was induced without difficulty. She was then positioned in the lithotomy position with Allen stirrups.  Pressure points were evaluated and padded.  A foley catheter was then placed by nursing under sterile conditions. Hair on the abdomen was clipped.  She was secured to the operating table. Urology then scrubbed for their portion of the procedure.  Please refer to Dr Guerry Bruin note for details regarding his portion of the procedure. The abdomen was then prepped and draped in the standard sterile fashion. Surgical  timeout was called indicating the correct patient, procedure, positioning and need for preoperative antibiotics.   An OG tube was placed by anesthesia and confirmed to be to suction.  At Palmer's point, a stab incision was created and the Veress needle was introduced into the peritoneal cavity on the first attempt.  Intraperitoneal location was confirmed by the aspiration and saline drop test.  Pneumoperitoneum was established to a maximum pressure of 15 mmHg using CO2.  Following this, the abdomen was marked for planned trocar sites.  Just to the right and cephalad to the umbilicus, an 8 mm incision was created and an 8 mm blunt tipped robotic trocar was cautiously placed into the peritoneal cavity.  The laparoscope was inserted and demonstrated no evidence of trocar site nor Veress needle site complications.  The Veress needle was removed.  Bilateral transversus abdominis plane blocks were then created using a dilute mixture of Exparel with Marcaine.  3 additional 8 mm robotic trochars were placed under direct visualization roughly in a line extending from the right ASIS towards the left upper quadrant. The bladder was inspected and noted to be at/below the pubic symphysis.   An additional 5 mm assist port was placed in the right lateral abdomen under direct visualization.  The abdomen was surveyed and there was adhesions of the small bowel to the midline as well as the omentum and small bowel mesentery. She was positioned in Trendelenburg with the left side tilted slightly up.  Small bowel was carefully retracted out of the pelvis.  The robot was then docked and I went to the console.  We began with adhesiolysis. Adhesions consisting of small bowel were carefully taken down sharply using robotic scissors.  The associated omentum was also mobilized.  The small bowel mesentery  was somewhat adherent and also able to be freed.  There were also some adhesions of the descending colon to the abdominal wall that were  carefully dissected sharply.  In total, this took approximately 90 minutes.  The small bowel was all then inspected and noted to be free of injury.  The colon was free of injury.  The omentum is hemostatic and perfused in appearance.  We are able to also free adhesions of small bowel from the pelvis.  After doing this, the sigmoid stump was identified.   It became apparent that we would need to resect this in order to facilitate a healthy colorectal anastomosis and minimize her risk for recurrent diverticulitis down the road.  Attachments of the sigmoid colon were taken down from the intersigmoid fossa.  The sigmoid was grasped and elevated anteriorly.  The plane between the fascia propria of the rectum and the presacral fascia was identified and the overlying peritoneum incised.  We did this in a hybrid medial to lateral type approach.  The left ureter was identified in the retroperitoneum and protected free of injury by "sweeping" it away.  The gonadal's were protected free of injury.  The intervening mesocolon and proximalmost aspect of the mesorectum was incised and divided using the vessel sealer.  We identified the proximal rectum where the tinea had splayed and there were loss of epiploica appendices.  This clearly represents the proximal rectum anatomically.  This overlies the sacral promontory.   Hypogastric nerves were seen going along the the presacral fascia and were protected free of injury.    We turned our attention to mobilizing the descending colon.  We are able to mobilize everything out to level the colostomy up to the level of the splenic flexure.  It appeared there to be more than adequate length of colon to reach into the pelvis without any tension.  I scrubbed back in.  The colostomy was circumferentially incised and subcutaneous tissue divided.  We are able to enter the peritoneal cavity.  The colostomy been fully mobilized.  A wound protector was placed.  The mesentery was divided  out to the level of planned transection just proximal to the mucocutaneous junction.  The colon at this level is clearly well perfused and there is a palpable pulse in the mesentery.  The pursestring device is applied.  A 2-0 Prolene on a Keith needle was passed.  The colostomy is then excised and passed off the specimen.  The pursestring is inspected and intact.  3-0 silk sutures were placed in a belt loop type configuration around this.  A 29 mm EEA is selected and the anvil placed in the pursestring tied.  A small amount of fat is cleared.  There are no apparent diverticula within this planned staple line.  This is placed back in the abdomen. A cap is placed over the wound protector and pneumoperitoneum reestablished.  The rectosigmoid colon is elevated anteriorly.  The 60 mm green load Echelon laparoscopic stapler was then passed.  The rectosigmoid colon is divided at the proximal rectum.  The staple line is inspected and noted to be intact and hemostatic.  The rectum this level is clearly well perfused in appearance and there is a visible pulse in the mesentery.  The rectosigmoid colon was passed off the specimen.  I then went below to pass the stapler.  My partner remained above.  EEA sizers were cautiously introduced via the anus and advanced under direct visualization.  The stapler was passed  and the spike deployed just anterior to the staple line.  The components were then mated.  Orientation was confirmed such that there is no twisting of the colon nor small bowel underneath the mesenteric defect. Care was taken to ensure no other structures were incorporated within this either.  The stapler was then closed, held, and fired. This was then removed. The donuts were inspected and noted to be complete.  The colon proximal to the anastomosis was then gently occluded. The pelvis was filled with sterile irrigation. Under direct visualization, I passed a flexible sigmoidoscope.  The anastomosis was under  water.  With good distention of the anastomosis there was no air leak. The anastomosis pink in appearance.  This is located at 15 cm from the anal verge by flexible sigmoidoscopy.  It is hemostatic.  Additionally, looking from above, there is no tension on the colon or mesentery.  Sigmoidoscope was withdrawn.  Irrigation was evacuated from the pelvis.  The abdomen and pelvis are surveyed and noted to be completely hemostatic without any apparent injury.  Under direct visualization, all trochars are removed.  The [place] wound protector was removed.     The rectus fascia was then closed using 2 running #1 PDS sutures at the colostomy takedown site.  The fascia was then palpated and noted to be completely closed.  Additional anesthetic was infiltrated at this site.  Sponge, needle, and instrument counts were reported correct x2. 4-0 Monocryl subcuticular suture was used to close the skin of all incision sites.  Dermabond was placed over all incisions.  A 0 Vicryl pursestring type suture was used to partially close the skin at the colostomy takedown site.  A moist 4 x 4 was placed to wick the wound.  This is covered with 4 x 4's and secured with tape.   She was then taken out of lithotomy, awakened from anesthesia, extubated, and transferred to a stretcher for transport to PACU in satisfactory condition having tolerated the procedure well.

## 2021-03-05 ENCOUNTER — Other Ambulatory Visit (HOSPITAL_COMMUNITY): Payer: Self-pay

## 2021-03-05 ENCOUNTER — Encounter (HOSPITAL_COMMUNITY): Payer: Self-pay | Admitting: Surgery

## 2021-03-05 LAB — CBC
HCT: 35.3 % — ABNORMAL LOW (ref 36.0–46.0)
Hemoglobin: 11.8 g/dL — ABNORMAL LOW (ref 12.0–15.0)
MCH: 30.2 pg (ref 26.0–34.0)
MCHC: 33.4 g/dL (ref 30.0–36.0)
MCV: 90.3 fL (ref 80.0–100.0)
Platelets: 352 10*3/uL (ref 150–400)
RBC: 3.91 MIL/uL (ref 3.87–5.11)
RDW: 13.6 % (ref 11.5–15.5)
WBC: 16.2 10*3/uL — ABNORMAL HIGH (ref 4.0–10.5)
nRBC: 0 % (ref 0.0–0.2)

## 2021-03-05 LAB — BASIC METABOLIC PANEL
Anion gap: 6 (ref 5–15)
BUN: 8 mg/dL (ref 6–20)
CO2: 24 mmol/L (ref 22–32)
Calcium: 8.2 mg/dL — ABNORMAL LOW (ref 8.9–10.3)
Chloride: 106 mmol/L (ref 98–111)
Creatinine, Ser: 0.72 mg/dL (ref 0.44–1.00)
GFR, Estimated: >60 mL/min (ref >60)
Glucose, Bld: 102 mg/dL — ABNORMAL HIGH (ref 70–99)
Potassium: 4.2 mmol/L (ref 3.5–5.1)
Sodium: 136 mmol/L (ref 135–145)

## 2021-03-05 LAB — SURGICAL PATHOLOGY

## 2021-03-05 MED ORDER — TRAMADOL HCL 50 MG PO TABS
50.0000 mg | ORAL_TABLET | Freq: Four times a day (QID) | ORAL | 0 refills | Status: AC | PRN
  Filled 2021-03-05: qty 15, 4d supply, fill #0

## 2021-03-05 MED ORDER — LIDOCAINE 5 % EX PTCH
1.0000 | MEDICATED_PATCH | CUTANEOUS | Status: DC
  Administered 2021-03-05: 12:00:00 1 via TRANSDERMAL
  Filled 2021-03-05 (×2): qty 1

## 2021-03-05 NOTE — Discharge Instructions (Addendum)
POST OP INSTRUCTIONS AFTER COLON SURGERY  DIET: Be sure to include lots of fluids daily to stay hydrated - 64oz of water per day (8, 8 oz glasses).  Avoid fast food or heavy meals for the first couple of weeks as your are more likely to get nauseated. Avoid raw/uncooked fruits or vegetables for the first 4 weeks (its ok to have these if they are blended into smoothie form). If you have fruits/vegetables, make sure they are cooked until soft enough to mash on the roof of your mouth and chew your food well. Otherwise, diet as tolerated.  Take your usually prescribed home medications unless otherwise directed.  PAIN CONTROL: Pain is best controlled by a usual combination of three different methods TOGETHER: Ice/Heat Over the counter pain medication Prescription pain medication Most patients will experience some swelling and bruising around the surgical site.  Ice packs or heating pads (30-60 minutes up to 6 times a day) will help. Some people prefer to use ice alone, heat alone, alternating between ice & heat.  Experiment to what works for you.  Swelling and bruising can take several weeks to resolve.   It is helpful to take an over-the-counter pain medication regularly for the first few weeks: Acetaminophen (Tylenol) - you may take 650mg  every 6 hours as needed. You can take this with motrin as they act differently on the body. If you are taking a narcotic pain medication that has acetaminophen in it, do not take over the counter tylenol at the same time. Ibuprofen - you may take 600 mg of this every 6 hours as needed for the first week as well. Generally, this works well for pain control and you may take it concurrently with tylenol. A  prescription for pain medication should be given to you upon discharge.  Take your pain medication as prescribed if your pain is not adequatly controlled with the over-the-counter pain reliefs mentioned above.  Avoid getting constipated.  Between the surgery and the  pain medications, it is common to experience some constipation.  Increasing fluid intake and taking a fiber supplement (such as Metamucil, Citrucel, FiberCon, MiraLax, etc) 1-2 times a day regularly will usually help prevent this problem from occurring.  A mild laxative (prune juice, Milk of Magnesia, MiraLax, etc) should be taken according to package directions if there are no bowel movements after 48 hours.    Dressing: Your incisions are covered in Dermabond which is like sterile superglue for the skin. This will come off on it's own in a couple weeks. It is waterproof and you may bathe normally starting the day after your surgery in a shower. Avoid baths/pools/lakes/oceans until your wounds have fully healed. Where your colostomy used to be, you may cover this with a piece of clean dry gauze and change 1-2x/day until the wound heals. It is ok to bathe normally with soap/water of the former colostomy site. This actually helps keep it clean!  ACTIVITIES as tolerated:   Avoid heavy lifting (>10lbs or 1 gallon of milk) for the next 6 weeks. You may resume regular daily activities as tolerated--such as daily self-care, walking, climbing stairs--gradually increasing activities as tolerated.  If you can walk 30 minutes without difficulty, it is safe to try more intense activity such as jogging, treadmill, bicycling, low-impact aerobics.  DO NOT PUSH THROUGH PAIN.  Let pain be your guide: If it hurts to do something, don't do it. You may drive when you are no longer taking prescription pain medication, you can  comfortably wear a seatbelt, and you can safely maneuver your car and apply brakes.  FOLLOW UP in our office Please call [facility] at [phone] to set up an appointment to see your surgeon in the office for a follow-up appointment approximately 2 weeks after your surgery. Make sure that you call for this appointment the day you arrive home to insure a convenient appointment time.  9. If you have  disability or family leave forms that need to be completed, you may have them completed by your primary care physician's office; for return to work instructions, please ask our office staff and they will be happy to assist you in obtaining this documentation   When to call us [phone]: Poor pain control Reactions / problems with new medications (rash/itching, etc)  Fever over 101.5 F (38.5 C) Inability to urinate Nausea/vomiting Worsening swelling or bruising Continued bleeding from incision. Increased pain, redness, or drainage from the incision  The clinic staff is available to answer your questions during regular business hours (8:30am-5pm).  Please dont hesitate to call and ask to speak to one of our nurses for clinical concerns.   A surgeon from [facility] is always on call at the hospitals   If you have a medical emergency, go to the nearest emergency room or call 911.  [facility], [place] [address] [place], [place], [place]  [address] MAIN: [phone] FAX: [phone] [email or web address]

## 2021-03-05 NOTE — Progress Notes (Signed)
PT Cancellation Note  Patient Details Name: Kristin Sawyer MRN: [id number] DOB: [birth year]   Cancelled Treatment:    Reason Eval/Treat Not Completed: PT screened, no needs identified, will sign off (per OT pt ambulating independently and no acute therapy needs. PT signing off. Please reconsult if there is a chagne in functional status/mobility.)   Gwynneth Albright PT, DPT Acute Rehabilitation Services Office [phone] Pager [phone]

## 2021-03-05 NOTE — Progress Notes (Signed)
Subjective No acute events. Feeling well. No n/v. Tolerating lots of liquids. Ambulating. Pain controlled. Notes chronic back pain and requests lidoderm patch.  Objective: Vital signs in last 24 hours: Temp:  [97.3 F (36.3 C)-98.6 F (37 C)] 98.4 F (36.9 C) (12/22 0911) Pulse Rate:  [61-89] 81 (12/22 0911) Resp:  [9-19] 16 (12/22 0911) BP: (97-110)/(47-81) 105/51 (12/22 0911) SpO2:  [92 %-100 %] 96 % (12/22 0911) FiO2 (%):  [21 %] 21 % (12/21 1154) Weight:  [141.1 kg] 141.1 kg (12/22 0500) Last BM Date:  (PTA)  Intake/Output from previous day: 12/21 0701 - 12/22 0700 In: 3425.7 [P.O.:700; I.V.:2625.7; IV Piggyback:100] Out: 1100 [Urine:1000; Blood:100] Intake/Output this shift: No intake/output data recorded.  Gen: NAD, comfortable CV: RRR Pulm: Normal work of breathing Abd: Soft, nondistended; minimal tenderness around sites. No rebound nor guarding. Colostomy takedown clean, dressing without saturation Ext: SCDs in place  Lab Results: CBC  Recent Labs    03/02/21 1103 03/05/21 0431  WBC 12.5* 16.2*  HGB 14.3 11.8*  HCT 42.9 35.3*  PLT 393 352   BMET Recent Labs    03/02/21 1103 03/05/21 0431  NA 139 136  K 3.9 4.2  CL 107 106  CO2 25 24  GLUCOSE 92 102*  BUN 10 8  CREATININE 0.66 0.72  CALCIUM 9.0 8.2*   PT/INR No results for input(s): LABPROT, INR in the last 72 hours. ABG No results for input(s): PHART, HCO3 in the last 72 hours.  Invalid input(s): PCO2, PO2  Studies/Results:  Anti-infectives: Anti-infectives (From admission, onward)    Start     Dose/Rate Route Frequency Ordered Stop   03/04/21 1400  neomycin (MYCIFRADIN) tablet 1,000 mg  Status:  Discontinued       See Hyperspace for full Linked Orders Report.   1,000 mg Oral 3 times per day 03/04/21 0611 03/04/21 0616   03/04/21 1400  metroNIDAZOLE (FLAGYL) tablet 1,000 mg  Status:  Discontinued       See Hyperspace for full Linked Orders Report.   1,000 mg Oral 3 times per day  03/04/21 9509 03/04/21 0616   03/04/21 0615  cefoTEtan (CEFOTAN) 2 g in sodium chloride 0.9 % 100 mL IVPB        2 g 200 mL/hr over 30 Minutes Intravenous On call to O.R. 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[place])    Diverticulitis 07/25/2020   Abscess, intestinal 07/25/2020   S/P partial colectomy 07/25/2020   Acute stress reaction 07/25/2020   Excessive crying 07/25/2020   Anxiety about health 07/25/2020   Diverticulitis of intestine with abscess 07/10/2020   New daily persistent headache 05/31/2017   Status migrainosus 05/31/2017   LGSIL on Pap smear of cervix 12/30/2016   Low iron stores 06/15/2016   Pernicious anemia 06/15/2016   Vitamin D deficiency 06/15/2016   Malnutrition of mild degree ([place]) 06/15/2016   No energy 06/15/2016   Depression, recurrent ([place]) 06/15/2016   Low back pain 06/15/2016   Morbid obesity ([place]) 06/15/2016   Active labor 06/29/2014   Spontaneous vaginal delivery 06/29/2014   s/p Procedure(s): XI ROBOTIC ASSISTED COLOSTOMY TAKEDOWN with low anterior resection, transabdominal plane block LYSIS OF ADHESION FLEXIBLE SIGMOIDOSCOPY CYSTOSCOPY with FIREFLY  INJECTION 03/04/2021  -Doing great -Remove foley -D/C IVF -Advance to soft -Continue entereg until reliable return of bowel function -Colostomy site dressing down tomorrow or sooner if saturates -Ppx: SQH, SCDs -We spent time reviewing her procedure, findings, recovery and expectations moving forward. Her questions have been answered. She expresses understanding and agreement with plan   LOS: 1 day     Nadeen Landau, MD [facility], [place]

## 2021-03-05 NOTE — Progress Notes (Signed)
OT Cancellation Note  Patient Details Name: Kristin Sawyer MRN: [id number] DOB: [birth year]   Cancelled Treatment:    Reason Eval/Treat Not Completed: OT screened, no needs identified, will sign off. Patient ambulating independently reports physical abilities to perform all ADLs.   Kristin Sawyer 03/05/2021, 10:21 AM

## 2021-03-06 ENCOUNTER — Telehealth: Payer: Self-pay | Admitting: General Practice

## 2021-03-06 LAB — CBC
HCT: 34.3 % — ABNORMAL LOW (ref 36.0–46.0)
Hemoglobin: 11.5 g/dL — ABNORMAL LOW (ref 12.0–15.0)
MCH: 29.8 pg (ref 26.0–34.0)
MCHC: 33.5 g/dL (ref 30.0–36.0)
MCV: 88.9 fL (ref 80.0–100.0)
Platelets: 317 10*3/uL (ref 150–400)
RBC: 3.86 MIL/uL — ABNORMAL LOW (ref 3.87–5.11)
RDW: 13.8 % (ref 11.5–15.5)
WBC: 11.6 10*3/uL — ABNORMAL HIGH (ref 4.0–10.5)
nRBC: 0 % (ref 0.0–0.2)

## 2021-03-06 NOTE — Discharge Summary (Signed)
Patient ID: Kristin Sawyer MRN: [id number] DOB/AGE: 1981/03/14 40 y.o.  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[place])    Diverticulitis 07/25/2020   Abscess, intestinal 07/25/2020   S/P partial colectomy 07/25/2020   Acute stress reaction 07/25/2020   Excessive crying 07/25/2020   Anxiety about health 07/25/2020   Diverticulitis of intestine with abscess 07/10/2020   New daily persistent headache 05/31/2017   Status migrainosus 05/31/2017   LGSIL on Pap smear of cervix 12/30/2016   Low iron stores 06/15/2016   Pernicious anemia 06/15/2016   Vitamin D deficiency 06/15/2016   Malnutrition of mild degree ([place]) 06/15/2016   No energy 06/15/2016   Depression, recurrent ([place]) 06/15/2016   Low back pain 06/15/2016   Morbid obesity ([place]) 06/15/2016   Active labor 06/29/2014   Spontaneous vaginal delivery 06/29/2014    Consultants None  Procedures OR 03/04/21 Robotic assisted low anterior resection with stapled colorectal anastomosis Robotic lysis of adhesions x 90 minutes Flexible sigmoidoscopy Bilateral transversus abdominus plane (TAP) blocks   Hospital Course: She was admitted postoperatively and had an uneventful recovery. Her diet was gradually advanced and she began having spontaneous bowel function. On POD#2 her pain was noted to be well controlled, mobilizing well on her own, tolerating diet. She was comfortable with and stable for discharge home.    Allergies as of 03/06/2021       Reactions   Other Other (See Comments)   Rondec DM  Reaction: causes hallucinations        Medication List     TAKE these medications    acetaminophen 500 MG tablet Commonly known as: TYLENOL Take 2 tablets (1,000  mg total) by mouth every 6 (six) hours as needed for mild pain.   bisacodyl 5 MG EC tablet Commonly known as: DULCOLAX Take 5 mg by mouth daily.   ibuprofen 200 MG tablet Commonly known as: ADVIL Take 400 mg by mouth every 8 (eight) hours as needed for moderate pain or headache.   methocarbamol 500 MG tablet Commonly known as: ROBAXIN Take 2 tablets (1,000 mg total) by mouth every 6 (six) hours as needed for muscle spasms.   Oxycodone HCl 10 MG Tabs Take 0.5-1 tablets (5-10 mg total) by mouth every 6 (six) hours as needed for moderate pain or severe pain.   polyethylene glycol 17 g packet Commonly known as: MiraLax Take 17 g by mouth daily. What changed:  when to take this reasons to take this   traMADol 50 MG tablet Commonly known as: Ultram Take 1 tablet (50 mg total) by mouth every 6 (six) hours as needed for up to 5 days (postop pain not controlled with tylenol and ibuprofen first).          Follow-up Information     Ileana Roup, MD Follow up in 2 week(s).   Specialties: General Surgery, Colon and Rectal Surgery Why: 2-3 weeks for postop appointment Contact information: [place] [phone]                 Sharon Mt. Dema Severin, M.D. [facility]

## 2021-03-06 NOTE — Progress Notes (Signed)
Subjective No acute events. Feeling well. No n/v. Tolerating soft. Ambulating. Pain controlled. Passing flatus and Bms now.  Objective: Vital signs in last 24 hours: Temp:  [97.7 F (36.5 C)-98.7 F (37.1 C)] 98.7 F (37.1 C) (12/23 0521) Pulse Rate:  [63-81] 76 (12/23 0521) Resp:  [16-18] 18 (12/22 2117) BP: (105-123)/(44-58) 123/47 (12/23 0521) SpO2:  [96 %-100 %] 98 % (12/23 0521) Weight:  [128.2 kg] 128.2 kg (12/23 0500) Last BM Date:  (PTA)  Intake/Output from previous day: 12/22 0701 - 12/23 0700 In: 120 [P.O.:120] Out: 400 [Urine:400] Intake/Output this shift: No intake/output data recorded.  Gen: NAD, comfortable CV: RRR Pulm: Normal work of breathing Abd: Soft, nondistended; minimal tenderness around sites. No rebound nor guarding. Colostomy takedown clean, dressing without saturation Ext: SCDs in place  Lab Results: CBC  Recent Labs    03/05/21 0431 03/06/21 0725  WBC 16.2* 11.6*  HGB 11.8* 11.5*  HCT 35.3* 34.3*  PLT 352 317   BMET Recent Labs    03/05/21 0431  NA 136  K 4.2  CL 106  CO2 24  GLUCOSE 102*  BUN 8  CREATININE 0.72  CALCIUM 8.2*   PT/INR No results for input(s): LABPROT, INR in the last 72 hours. ABG No results for input(s): PHART, HCO3 in the last 72 hours.  Invalid input(s): PCO2, PO2  Studies/Results:  Anti-infectives: Anti-infectives (From admission, onward)    Start     Dose/Rate Route Frequency Ordered Stop   03/04/21 1400  neomycin (MYCIFRADIN) tablet 1,000 mg  Status:  Discontinued       See Hyperspace for full Linked Orders Report.   1,000 mg Oral 3 times per day 03/04/21 0611 03/04/21 0616   03/04/21 1400  metroNIDAZOLE (FLAGYL) tablet 1,000 mg  Status:  Discontinued       See Hyperspace for full Linked Orders Report.   1,000 mg Oral 3 times per day 03/04/21 7017 03/04/21 0616   03/04/21 0615  cefoTEtan (CEFOTAN) 2 g in sodium chloride 0.9 % 100 mL IVPB        2 g 200 mL/hr over 30 Minutes Intravenous On  call to O.R. 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[place])    Diverticulitis 07/25/2020   Abscess, intestinal 07/25/2020   S/P partial colectomy 07/25/2020   Acute stress reaction 07/25/2020   Excessive crying 07/25/2020   Anxiety about health 07/25/2020   Diverticulitis of intestine with abscess 07/10/2020   New daily persistent headache 05/31/2017   Status migrainosus 05/31/2017   LGSIL on Pap smear of cervix 12/30/2016   Low iron stores 06/15/2016   Pernicious anemia 06/15/2016   Vitamin D deficiency 06/15/2016   Malnutrition of mild degree ([place]) 06/15/2016   No energy 06/15/2016   Depression, recurrent ([place]) 06/15/2016   Low back pain 06/15/2016   Morbid obesity ([place]) 06/15/2016   Active labor 06/29/2014   Spontaneous vaginal delivery 06/29/2014   s/p Procedure(s): XI ROBOTIC ASSISTED COLOSTOMY TAKEDOWN with low anterior resection, transabdominal plane block LYSIS OF ADHESION FLEXIBLE SIGMOIDOSCOPY CYSTOSCOPY with FIREFLY INJECTION 03/04/2021  -Doing great -Comfortable with and stable for discharge home today; we reviewed expectations and things to watch out for. Discussed wound care plan with covering  ostomy site with dry gauze, changing 1-2x/day as needed. -Ppx: SQH, SCDs -We spent time reviewing her procedure, findings, recovery and expectations moving forward. Her questions have been answered. She expresses understanding and agreement with plan   LOS: 2 days     Nadeen Landau, MD [facility], [place]

## 2021-03-06 NOTE — Telephone Encounter (Signed)
Transition Care Management Follow-up Telephone Call Date of discharge and from where: 03/06/21 from [facility] How have you been since you were released from the hospital? Doing ok.  Any questions or concerns? No  Items Reviewed: Did the pt receive and understand the discharge instructions provided? Yes  Medications obtained and verified? Yes  Other? No  Any new allergies since your discharge? No  Dietary orders reviewed? Yes Do you have support at home? Yes   Home Care and Equipment/Supplies: Were home health services ordered? no  Functional Questionnaire: (I = Independent and D = Dependent) ADLs: I  Bathing/Dressing- I  Meal Prep- I  Eating- I  Maintaining continence- I  Transferring/Ambulation- I  Managing Meds- I  Follow up appointments reviewed:  PCP Hospital f/u appt confirmed? No   Specialist Hospital f/u appt confirmed? Yes  Scheduled to see Dr. Dema Severin on 03/23/21 @ 0830. Are transportation arrangements needed? No  If their condition worsens, is the pt aware to call PCP or go to the Emergency Dept.? Yes Was the patient provided with contact information for the PCP's office or ED? Yes Was to pt encouraged to call back with questions or concerns? Yes

## 2021-03-06 NOTE — Progress Notes (Signed)
Pt discharged home with mother. Taken out to car via w/c in [place].

## 2021-04-08 LAB — HM PAP SMEAR
HM Pap smear: NEGATIVE
HM Pap smear: NEGATIVE

## 2021-04-08 LAB — HM MAMMOGRAPHY

## 2021-04-23 ENCOUNTER — Encounter: Payer: Self-pay | Admitting: Neurology

## 2021-08-26 ENCOUNTER — Ambulatory Visit
Admission: RE | Admit: 2021-08-26 | Discharge: 2021-08-26 | Disposition: A | Discharge status: Home / Self Care | Payer: 59 | Source: Ambulatory Visit | Attending: Family Medicine | Admitting: Family Medicine

## 2021-08-26 VITALS — BP 132/77 | HR 80 | Temp 98.0°F | Resp 18

## 2021-08-26 DIAGNOSIS — H6690 Otitis media, unspecified, unspecified ear: Secondary | ICD-10-CM

## 2021-08-26 MED ORDER — AMOXICILLIN-POT CLAVULANATE 875-125 MG PO TABS: 1.0000 | ORAL_TABLET | Freq: Two times a day (BID) | ORAL | 0 refills | Status: AC

## 2021-08-26 NOTE — Discharge Instructions (Addendum)
Take amoxicillin-clavulanate 875 mg--1 tab twice daily with food for 7 days  Followup with your primary care if not improving within the next 2-3 days.

## 2021-08-26 NOTE — ED Provider Notes (Signed)
[facility]    CSN: [id number] Arrival date & time: 08/26/21  1554      History   Chief Complaint Chief Complaint  Patient presents with   right ear pressure    HPI Kristin Sawyer is a 41 y.o. female.   HPI Here for muffled sound in her right ear and some tinnitus.  It may be feels full, but she does not really note pain.  On June 3, she began having some postnasal drainage and then it worsened while she was out of town.  She was then prescribed a Z-Pak and a steroid pack when she was out of town and has finished the Z-Pak and is about to finish the Medrol pack.  The congestion all is improved but the ear symptoms are not  Past Medical History:  Diagnosis Date   B12 nutritional deficiency    Colonic diverticular abscess    Condyloma    Diverticulitis    Hiatal hernia    History of marijuana use    pre-pregnancy   History of trichomoniasis 03/15/2013   HPV in female    Hx of abnormal cervical Papanicolaou smear    Hx of adenoidectomy    Hx of tonsillectomy    Ovarian cyst    Pulmonary nodule     Patient Active Problem List   Diagnosis Date Noted   S/P colostomy takedown 03/04/2021   Colon cancer screening    History of colonic diverticulitis    Benign neoplasm of cecum    Diverticulitis of colon    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[place])    Diverticulitis 07/25/2020   Abscess, intestinal 07/25/2020   S/P partial colectomy 07/25/2020   Acute stress reaction 07/25/2020   Excessive crying 07/25/2020   Anxiety about health 07/25/2020   Diverticulitis of intestine with abscess 07/10/2020   New daily persistent headache 05/31/2017   Status migrainosus 05/31/2017   LGSIL on Pap smear of cervix 12/30/2016   Low iron stores 06/15/2016   Pernicious anemia 06/15/2016   Vitamin D deficiency 06/15/2016   Malnutrition of mild degree ([place]) 06/15/2016   No energy 06/15/2016   Depression, recurrent ([place]) 06/15/2016   Low back pain 06/15/2016   Morbid obesity ([place])  06/15/2016   Active labor 06/29/2014   Spontaneous vaginal delivery 06/29/2014    Past Surgical History:  Procedure Laterality Date   COLONOSCOPY WITH PROPOFOL N/A 12/09/2020   Procedure: COLONOSCOPY WITH PROPOFOL;  Surgeon: Jerene Bears, MD;  Location: [facility];  Service: Gastroenterology;  Laterality: N/A;   COLOSTOMY     CYSTOSCOPY WITH STENT PLACEMENT Left 07/17/2020   Procedure: CYSTOSCOPY WITH STENT PLACEMENT;  Surgeon: Raynelle Bring, MD;  Location: [facility];  Service: Urology;  Laterality: Left;   EXPLORATORY LAPAROTOMY  07/17/2020   with segmental colectomy   FLEXIBLE SIGMOIDOSCOPY N/A 03/04/2021   Procedure: FLEXIBLE SIGMOIDOSCOPY;  Surgeon: Ileana Roup, MD;  Location: [facility];  Service: General;  Laterality: N/A;   LYSIS OF ADHESION N/A 03/04/2021   Procedure: LYSIS OF ADHESION;  Surgeon: Ileana Roup, MD;  Location: [facility];  Service: General;  Laterality: N/A;   PARTIAL COLECTOMY N/A 07/17/2020   Procedure: PARTIAL COLECTOMY;  Surgeon: Jesusita Oka, MD;  Location: [facility];  Service: General;  Laterality: N/A;   POLYPECTOMY  12/09/2020   Procedure: POLYPECTOMY;  Surgeon: Jerene Bears, MD;  Location: [facility];  Service: Gastroenterology;;   POLYPECTOMY     TONSILLECTOMY     XI ROBOTIC ASSISTED COLOSTOMY TAKEDOWN N/A  03/04/2021   Procedure: XI ROBOTIC ASSISTED COLOSTOMY TAKEDOWN with low anterior resection, transabdominal plane block;  Surgeon: Ileana Roup, MD;  Location: [facility];  Service: General;  Laterality: N/A;    OB History     Gravida  1   Para  1   Term  1   Preterm      AB      Living  1      SAB      IAB      Ectopic      Multiple  0   Live Births  1            Home Medications    Prior to Admission medications   Medication Sig Start Date End Date Taking? Authorizing Provider  amoxicillin-clavulanate (AUGMENTIN) 875-125 MG tablet Take 1 tablet by mouth 2 (two) times daily for 7 days. 08/26/21 09/02/21  Yes Elizabethanne Lusher, Gwenlyn Perking, MD  bisacodyl (DULCOLAX) 5 MG EC tablet Take 5 mg by mouth daily.    [provider]  ibuprofen (ADVIL) 200 MG tablet Take 400 mg by mouth every 8 (eight) hours as needed for moderate pain or headache.    [provider]  methocarbamol (ROBAXIN) 500 MG tablet Take 2 tablets (1,000 mg total) by mouth every 6 (six) hours as needed for muscle spasms. Patient not taking: Reported on 12/02/2020 07/25/20   Margie Billet A, PA-C  polyethylene glycol (MIRALAX) 17 g packet Take 17 g by mouth daily. Patient taking differently: Take 17 g by mouth 3 (three) times daily as needed for moderate constipation. 12/09/20   Pyrtle, Lajuan Lines, MD    Family History Family History  Problem Relation Age of Onset   Diabetes Mother    Irritable bowel syndrome Mother    Colon polyps Mother 52   Diabetes Father    Heart disease Father    Hyperlipidemia Father    Hypertension Father    Depression Sister    Pancreatic cancer Maternal Aunt    Cancer Maternal Aunt    Alcohol abuse Maternal Uncle    Diabetes Maternal Grandmother     Social History Social History   Tobacco Use   Smoking status: Former    Packs/day: 1.00    Types: Cigarettes    Quit date: 02/16/2021    Years since quitting: 0.5   Smokeless tobacco: Never  Vaping Use   Vaping Use: Former   Quit date: 02/16/2021  Substance Use Topics   Alcohol use: Not Currently   Drug use: No     Allergies   Other   Review of Systems Review of Systems   Physical Exam Triage Vital Signs ED Triage Vitals  Enc Vitals Group     BP 08/26/21 1617 132/77     Pulse Rate 08/26/21 1617 80     Resp 08/26/21 1617 18     Temp 08/26/21 1617 98 F (36.7 C)     Temp Source 08/26/21 1617 Oral     SpO2 08/26/21 1617 98 %     Weight --      Height --      Head Circumference --      Peak Flow --      Pain Score 08/26/21 1618 0     Pain Loc --      Pain Edu? --      Excl. in [place]? --    No data found.  Updated Vital  Signs BP 132/77 (BP Location: Left Arm)  Pulse 80   Temp 98 F (36.7 C) (Oral)   Resp 18   SpO2 98%   Breastfeeding No   Visual Acuity Right Eye Distance:   Left Eye Distance:   Bilateral Distance:    Right Eye Near:   Left Eye Near:    Bilateral Near:     Physical Exam Vitals reviewed.  Constitutional:      General: She is not in acute distress.    Appearance: She is not ill-appearing, toxic-appearing or diaphoretic.  HENT:     Left Ear: Tympanic membrane and ear canal normal.     Ears:     Comments: Right tympanic membrane is angry and red on the superior portion and it is dull and opaque and white on the bottom portion.  There is a little blood in the canal but I cannot tell that I see any perforation at this point.    Nose: Nose normal.     Mouth/Throat:     Mouth: Mucous membranes are moist.     Pharynx: No oropharyngeal exudate or posterior oropharyngeal erythema.  Eyes:     Extraocular Movements: Extraocular movements intact.     Pupils: Pupils are equal, round, and reactive to light.  Cardiovascular:     Rate and Rhythm: Normal rate and regular rhythm.  Skin:    Coloration: Skin is not jaundiced or pale.  Neurological:     General: No focal deficit present.     Mental Status: She is alert and oriented to person, place, and time.  Psychiatric:        Behavior: Behavior normal.      UC Treatments / Results  Labs (all labs ordered are listed, but only abnormal results are displayed) Labs Reviewed - No data to display  EKG   Radiology No results found.  Procedures Procedures (including critical care time)  Medications Ordered in UC Medications - No data to display  Initial Impression / Assessment and Plan / UC Course  I have reviewed the triage vital signs and the nursing notes.  Pertinent labs & imaging results that were available during my care of the patient were reviewed by me and considered in my medical decision making (see chart for  details).     I am going to treat with Augmentin for the otitis media Final Clinical Impressions(s) / UC Diagnoses   Final diagnoses:  Acute otitis media, unspecified otitis media type     Discharge Instructions      Take amoxicillin-clavulanate 875 mg--1 tab twice daily with food for 7 days  Followup with your primary care if not improving within the next 2-3 days.       ED Prescriptions     Medication Sig Dispense Auth. Provider   amoxicillin-clavulanate (AUGMENTIN) 875-125 MG tablet Take 1 tablet by mouth 2 (two) times daily for 7 days. 14 tablet Mignonne Afonso, Gwenlyn Perking, MD      PDMP not reviewed this encounter.   Barrett Henle, MD 08/26/21 [phone]

## 2021-08-26 NOTE — ED Triage Notes (Signed)
Pt c/o right ear pressure. States was previously dx w/ sinusitis and given zpak and steroid. States completed zpak but has 1 more dose of steroid. States sound in ear is muffled but denies pain.

## 2021-11-12 IMAGING — RF DG BE SINGLE CONTRAST
11 of 13 series · 15 of 20 positions shown · non-contrast
Comparison: CT scan 08/08/2020.

CLINICAL DATA: History of colostomy for diverticulitis.
Preoperative assessment for colostomy takedown.

EXAM:
SINGLE CONTRAST BARIUM ENEMA
TECHNIQUE: Initial scout AP supine abdominal image obtained to insure adequate
colon cleansing. Barium was introduced into the colon in a
retrograde fashion and refluxed from the rectum to the cecum. Spot
images of the colon followed by overhead radiographs were obtained.
FLUOROSCOPY TIME:  Fluoroscopy Time:  1 minutes and 18 seconds.
Radiation Exposure Index (if provided by the fluoroscopic device):
119 mGy
Number of Acquired Spot Images:

[Series 1: t abdomen supine · 0.15mm/px · 1 of 1 slices shown]
[im 1/1]
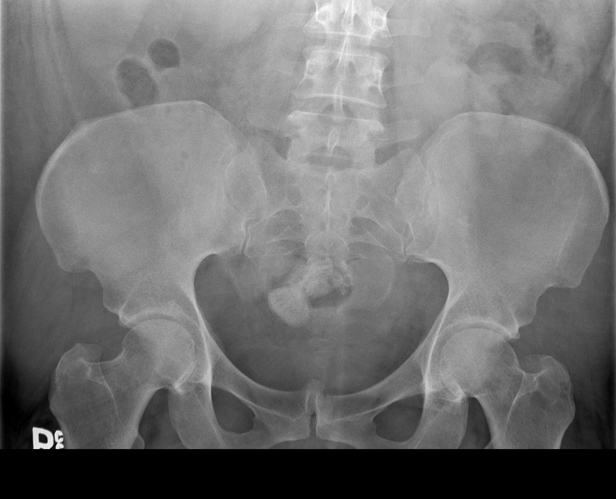

[Series 3: fluoro_barium 2fps_bw · 0.17mm/px · 3 of 4 frames shown (1 of 10)]
[frame 1/4]
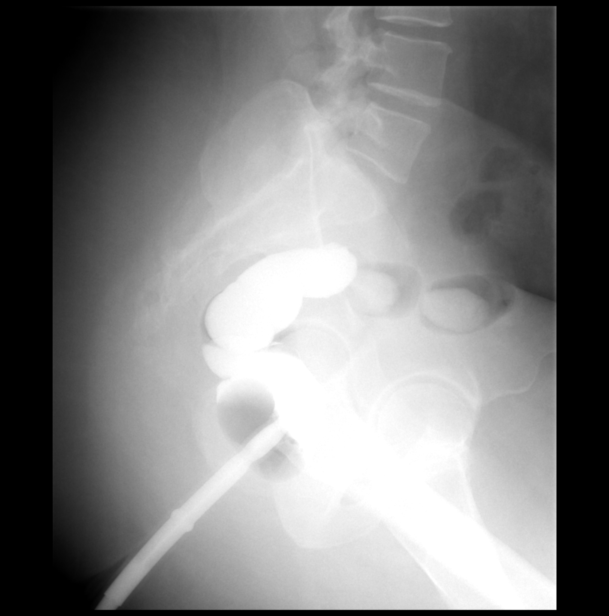
[frame 3/4]
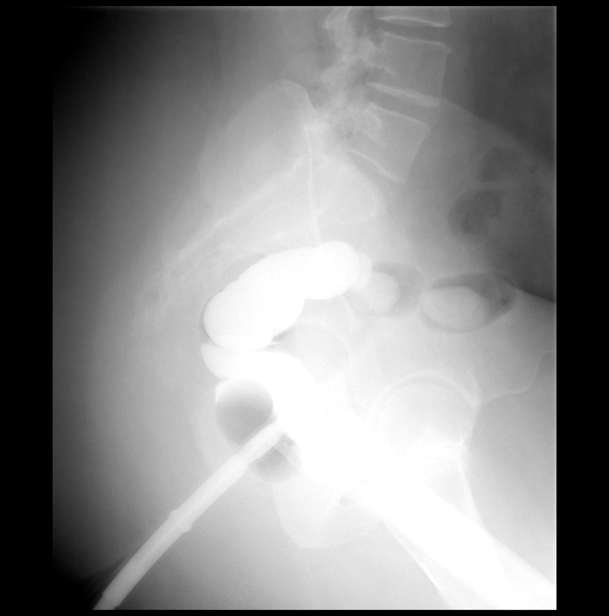
[frame 4/4]
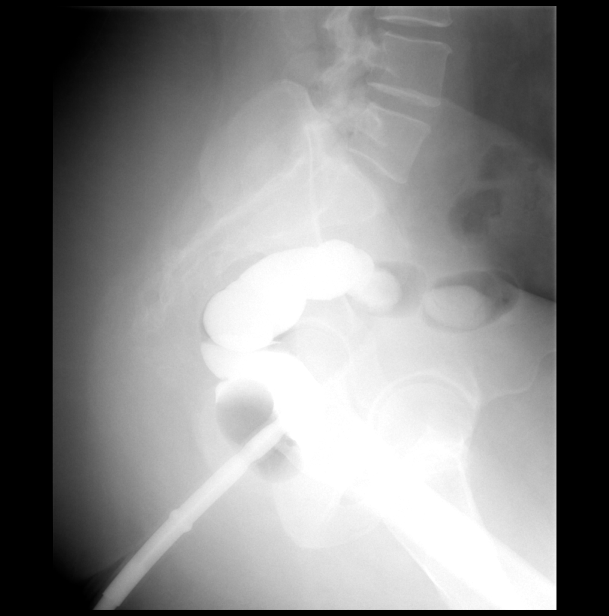

[Series 4: fluoro_barium 2fps_bw · 0.17mm/px · 2 of 3 frames shown (2 of 10)]
[frame 2/3]
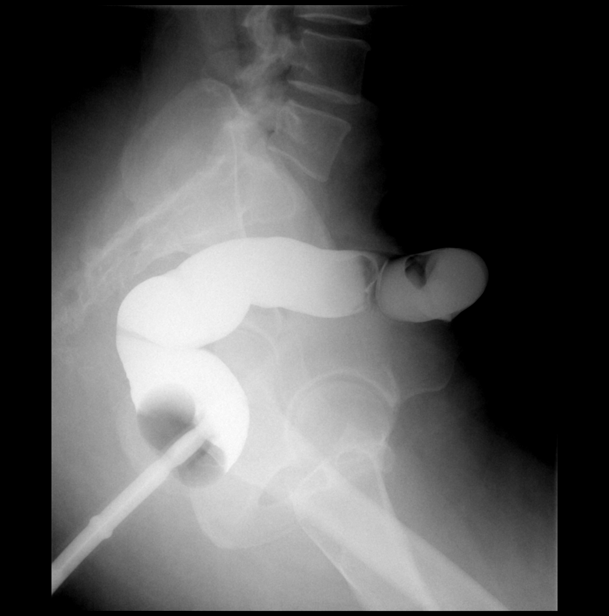
[frame 3/3]
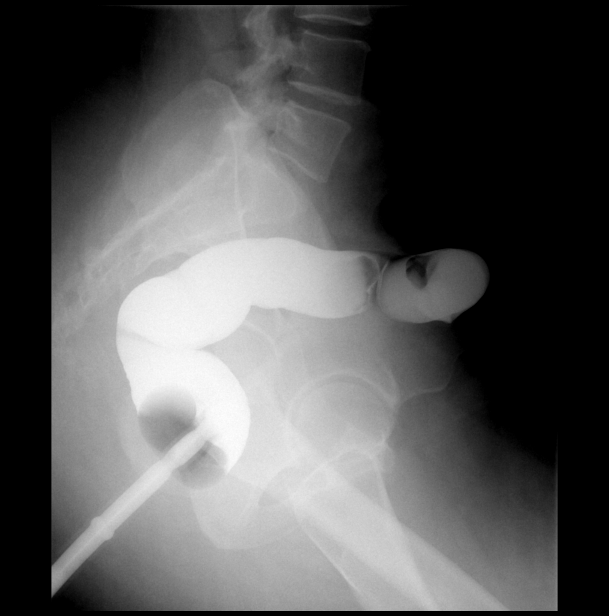

[Series 5: fluoro_barium 2fps_bw · 0.17mm/px · 1 of 2 frames shown (3 of 10)]
[frame 1/2]
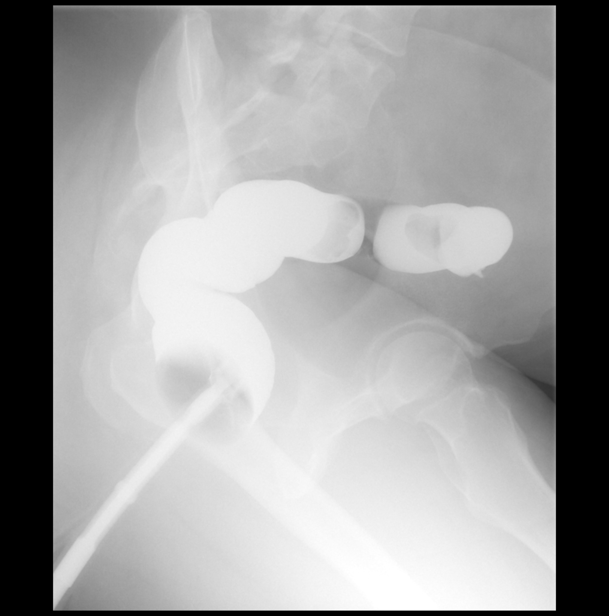

[Series 6: fluoro_barium 2fps_bw · 0.17mm/px · 2 of 2 frames shown (4 of 10)]
[frame 1/2]
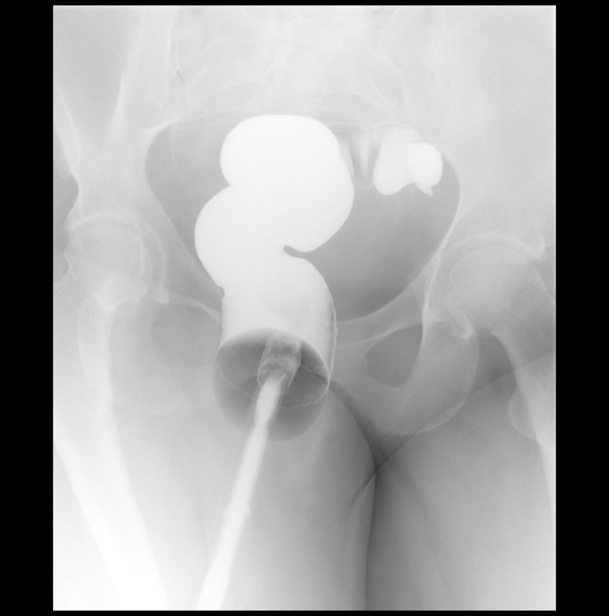
[frame 2/2]
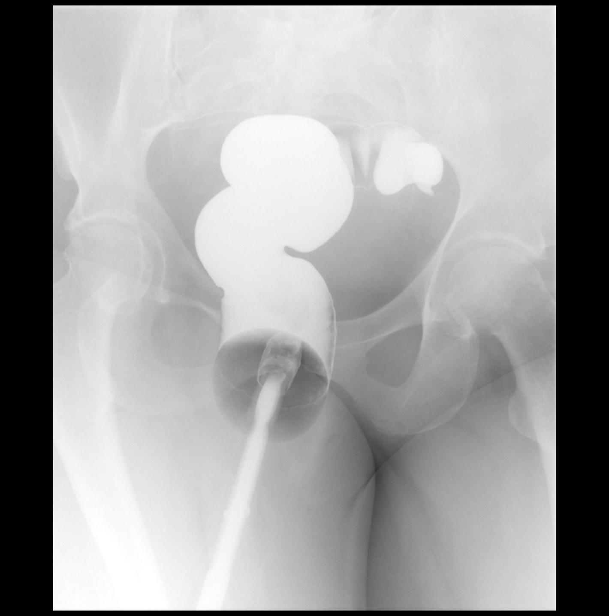

[Series 7: fluoro_barium 2fps_bw · 0.17mm/px · 1 of 2 frames shown (5 of 10)]
[frame 1/2]
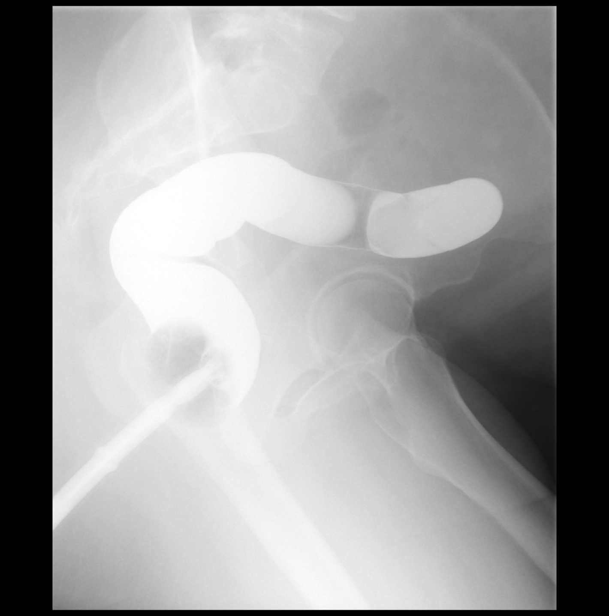

[Series 8: fluoro_barium 2fps_bw · 0.17mm/px · 1 of 1 slices shown (6 of 10)]
[im 1/1]
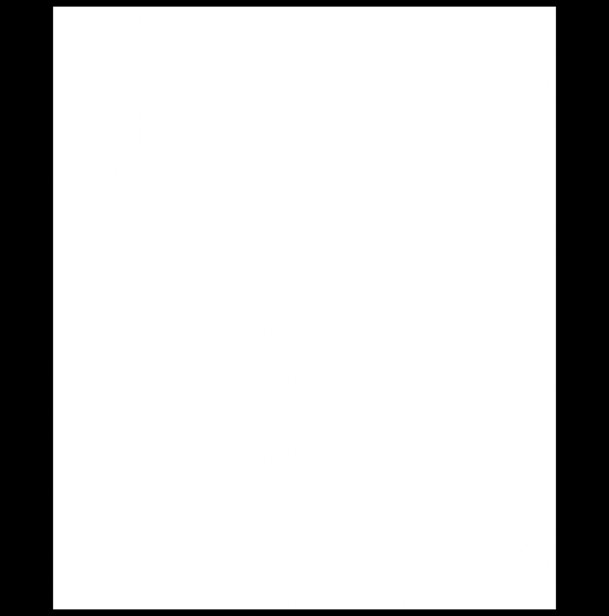

[Series 9: fluoro_barium 2fps_bw · 0.17mm/px · 1 of 1 slices shown (7 of 10)]
[im 1/1]
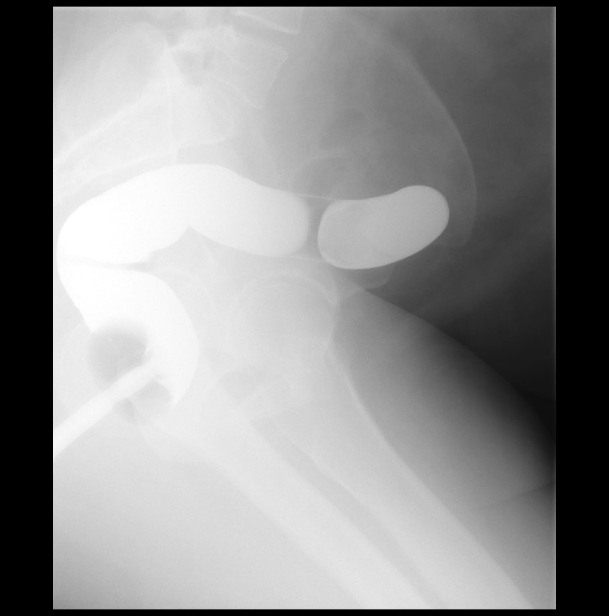

[Series 10: fluoro_barium 2fps_bw · 0.17mm/px · 1 of 1 slices shown (8 of 10)]
[im 1/1]
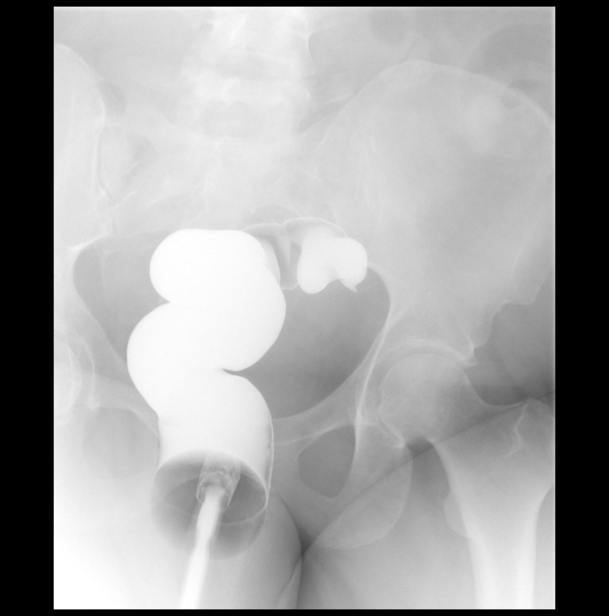

[Series 12: fluoro_barium 2fps_bw · 0.17mm/px · 1 of 1 slices shown (9 of 10)]
[im 1/1]
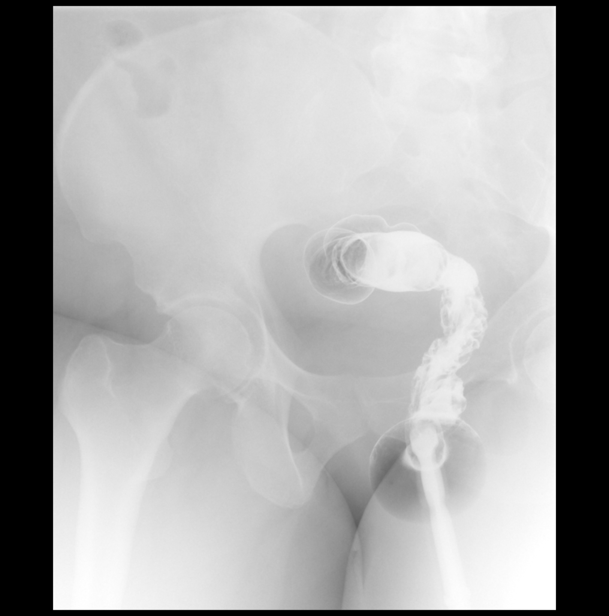

[Series 13: fluoro_barium 2fps_bw · 0.17mm/px · 1 of 1 slices shown (10 of 10)]
[im 1/1]
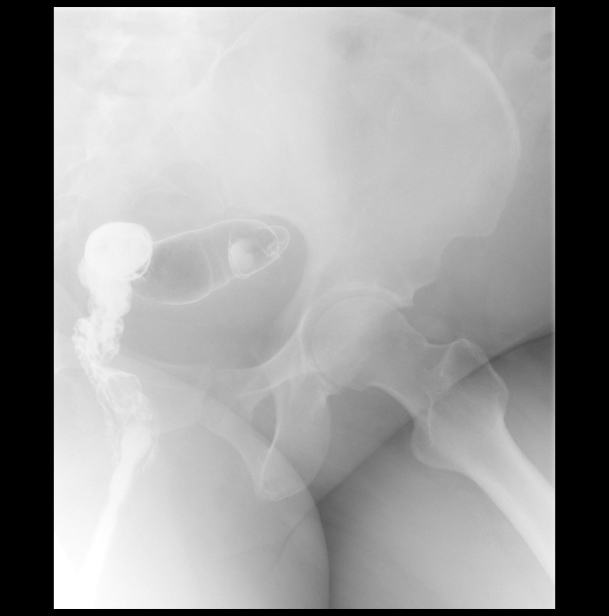

[15 of 20 positions shown; findings below may reference images not displayed]

FINDINGS: Pre-procedure KUB shows a normal bowel gas pattern.

Patient was placed left-side-down decubitus on the fluoro table and
a barium enema tip was inserted into the rectum. Thin barium was
then introduced in a retrograde fashion, fully opacifying the rectal
stump. No evidence for diverticular disease within the rectal stump.
No stricture of the rectal stump.
IMPRESSION: No evidence for stricture or diverticular disease of the rectal
stump.

## 2022-09-22 ENCOUNTER — Telehealth: Payer: Self-pay | Admitting: Physician Assistant

## 2022-09-22 ENCOUNTER — Ambulatory Visit (INDEPENDENT_AMBULATORY_CARE_PROVIDER_SITE_OTHER): Payer: 59 | Admitting: Physician Assistant

## 2022-09-22 ENCOUNTER — Encounter: Payer: Self-pay | Admitting: Physician Assistant

## 2022-09-22 VITALS — BP 106/66 | HR 64 | Ht 63.0 in | Wt 291.0 lb

## 2022-09-22 DIAGNOSIS — Z0000 Encounter for general adult medical examination without abnormal findings: Secondary | ICD-10-CM

## 2022-09-22 DIAGNOSIS — Z1159 Encounter for screening for other viral diseases: Secondary | ICD-10-CM

## 2022-09-22 DIAGNOSIS — Z1231 Encounter for screening mammogram for malignant neoplasm of breast: Secondary | ICD-10-CM

## 2022-09-22 DIAGNOSIS — Z13220 Encounter for screening for lipoid disorders: Secondary | ICD-10-CM

## 2022-09-22 DIAGNOSIS — Z131 Encounter for screening for diabetes mellitus: Secondary | ICD-10-CM | POA: Diagnosis not present

## 2022-09-22 DIAGNOSIS — B029 Zoster without complications: Secondary | ICD-10-CM

## 2022-09-22 DIAGNOSIS — Z6841 Body Mass Index (BMI) 40.0 and over, adult: Secondary | ICD-10-CM

## 2022-09-22 DIAGNOSIS — F17200 Nicotine dependence, unspecified, uncomplicated: Secondary | ICD-10-CM

## 2022-09-22 LAB — CBC WITH DIFFERENTIAL/PLATELET
Basophils Absolute: 56 cells/uL (ref 0–200)
Eosinophils Relative: 0.4 %
Lymphs Abs: 5134 cells/uL — ABNORMAL HIGH (ref 850–3900)
MPV: 9.7 fL (ref 7.5–12.5)
Neutro Abs: 12034 cells/uL — ABNORMAL HIGH (ref 1500–7800)
Platelets: 402 10*3/uL — ABNORMAL HIGH (ref 140–400)
Total Lymphocyte: 27.6 %
WBC: 18.6 10*3/uL — ABNORMAL HIGH (ref 3.8–10.8)

## 2022-09-22 MED ORDER — VARENICLINE TARTRATE 1 MG PO TABS
1.0000 mg | ORAL_TABLET | Freq: Two times a day (BID) | ORAL | 1 refills | Status: DC
Start: 2022-10-23 — End: 2022-12-27

## 2022-09-22 MED ORDER — VARENICLINE TARTRATE (STARTER) 0.5 MG X 11 & 1 MG X 42 PO TBPK: ORAL_TABLET | ORAL | 0 refills | Status: DC

## 2022-09-22 NOTE — Progress Notes (Signed)
Complete physical exam  Patient: Kristin Sawyer   DOB: May 21, 1980   42 y.o. Female  MRN: [id number]  Subjective:    Chief Complaint  Patient presents with   Annual Exam    Patient requesting assistance for smoking cessation ( had restarted  1 1/2 years ago. Patient also recently  dx with shingles - given Valtrex and Prednisone.     Kristin Sawyer is a 42 y.o. female who presents today for a complete physical exam. She reports consuming a general diet. The patient does not participate in regular exercise at present. She generally feels well. She reports sleeping well. She does have additional problems to discuss today.   She started back smoking 1 pack a day about 1 year ago and really wants to quit. She feels like she needs help.    Most recent fall risk assessment:    09/22/2022    8:24 AM  Fall Risk   Falls in the past year? 1  Number falls in past yr: 0  Injury with Fall? 1  Risk for fall due to : History of fall(s)  Follow up Falls evaluation completed     Most recent depression screenings:    09/22/2022    8:25 AM 07/25/2020    9:59 AM  PHQ 2/9 Scores  PHQ - 2 Score 0 4  PHQ- 9 Score  18    Vision:Within last year and Dental: No current dental problems and Receives regular dental care  Patient Active Problem List   Diagnosis Date Noted   Tobacco dependence 09/22/2022   S/P colostomy takedown 03/04/2021   Colon cancer screening    History of colonic diverticulitis    Benign neoplasm of cecum    Diverticulitis of colon    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HCC)    Diverticulitis 07/25/2020   Abscess, intestinal 07/25/2020   S/P partial colectomy 07/25/2020   Acute stress reaction 07/25/2020   Excessive crying 07/25/2020   Anxiety about health 07/25/2020   Diverticulitis of intestine with abscess 07/10/2020   New daily persistent headache 05/31/2017   Status migrainosus 05/31/2017   LGSIL on Pap smear of cervix 12/30/2016   Low iron stores 06/15/2016   Pernicious  anemia 06/15/2016   Vitamin D deficiency 06/15/2016   Malnutrition of mild degree (HCC) 06/15/2016   No energy 06/15/2016   Depression, recurrent (HCC) 06/15/2016   Low back pain 06/15/2016   Class 3 severe obesity due to excess calories without serious comorbidity with body mass index (BMI) of 50.0 to 59.9 in adult (HCC) 06/15/2016   Active labor 06/29/2014   Spontaneous vaginal delivery 06/29/2014   Past Medical History:  Diagnosis Date   B12 nutritional deficiency    Colonic diverticular abscess    Condyloma    Diverticulitis    Hiatal hernia    History of marijuana use    pre-pregnancy   History of trichomoniasis 03/15/2013   HPV in female    Hx of abnormal cervical Papanicolaou smear    Hx of adenoidectomy    Hx of tonsillectomy    Ovarian cyst    Pulmonary nodule    Past Surgical History:  Procedure Laterality Date   COLONOSCOPY WITH PROPOFOL N/A 12/09/2020   Procedure: COLONOSCOPY WITH PROPOFOL;  Surgeon: Beverley Fiedler, MD;  Location: [facility];  Service: Gastroenterology;  Laterality: N/A;   COLOSTOMY     CYSTOSCOPY WITH STENT PLACEMENT Left 07/17/2020   Procedure: CYSTOSCOPY WITH STENT PLACEMENT;  Surgeon: Heloise Purpura, MD;  Location: [facility];  Service: Urology;  Laterality: Left;   EXPLORATORY LAPAROTOMY  07/17/2020   with segmental colectomy   FLEXIBLE SIGMOIDOSCOPY N/A 03/04/2021   Procedure: FLEXIBLE SIGMOIDOSCOPY;  Surgeon: Andria Meuse, MD;  Location: [facility];  Service: General;  Laterality: N/A;   LYSIS OF ADHESION N/A 03/04/2021   Procedure: LYSIS OF ADHESION;  Surgeon: Andria Meuse, MD;  Location: [facility];  Service: General;  Laterality: N/A;   PARTIAL COLECTOMY N/A 07/17/2020   Procedure: PARTIAL COLECTOMY;  Surgeon: Diamantina Monks, MD;  Location: [facility];  Service: General;  Laterality: N/A;   POLYPECTOMY  12/09/2020   Procedure: POLYPECTOMY;  Surgeon: Beverley Fiedler, MD;  Location: [facility];  Service: Gastroenterology;;    POLYPECTOMY     TONSILLECTOMY     XI ROBOTIC ASSISTED COLOSTOMY TAKEDOWN N/A 03/04/2021   Procedure: XI ROBOTIC ASSISTED COLOSTOMY TAKEDOWN with low anterior resection, transabdominal plane block;  Surgeon: Andria Meuse, MD;  Location: [facility];  Service: General;  Laterality: N/A;   Family History  Problem Relation Age of Onset   Diabetes Mother    Irritable bowel syndrome Mother    Colon polyps Mother 63   Diabetes Father    Heart disease Father    Hyperlipidemia Father    Hypertension Father    Depression Sister    Pancreatic cancer Maternal Aunt    Cancer Maternal Aunt    Alcohol abuse Maternal Uncle    Diabetes Maternal Grandmother    Allergies  Allergen Reactions   Other Other (See Comments)    Rondec DM  Reaction: causes hallucinations      Patient Care Team: Nolene Ebbs as PCP - General (Family Medicine)   Outpatient Medications Prior to Visit  Medication Sig   Glucosamine HCl-MSM (MSM GLUCOSAMINE PO) Take by mouth.   ibuprofen (ADVIL) 200 MG tablet Take 400 mg by mouth every 8 (eight) hours as needed for moderate pain or headache.   predniSONE (STERAPRED UNI-PAK 48 TAB) 10 MG (48) TBPK tablet See admin instructions. see package   valACYclovir (VALTREX) 1000 MG tablet Take 1,000 mg by mouth 2 (two) times daily.   valACYclovir HCl (VALTREX PO) Take by mouth.   [DISCONTINUED] methocarbamol (ROBAXIN) 500 MG tablet Take 2 tablets (1,000 mg total) by mouth every 6 (six) hours as needed for muscle spasms.   [DISCONTINUED] PREDNISONE PO Take by mouth.   [DISCONTINUED] polyethylene glycol (MIRALAX) 17 g packet Take 17 g by mouth daily. (Patient not taking: Reported on 09/22/2022)   No facility-administered medications prior to visit.    ROS  See HPI>       Objective:     BP 106/66   Pulse 64   Ht 5\' 3"  (1.6 m)   Wt 291 lb (132 kg)   SpO2 99%   BMI 51.55 kg/m  BP Readings from Last 3 Encounters:  09/22/22 106/66  08/26/21 132/77  03/06/21  (!) 123/47   Wt Readings from Last 3 Encounters:  09/22/22 291 lb (132 kg)  03/06/21 282 lb 10.1 oz (128.2 kg)  03/02/21 267 lb (121.1 kg)    ..    09/22/2022    8:25 AM 07/25/2020    9:59 AM 06/14/2016    9:28 AM  Depression screen PHQ 2/9  Decreased Interest 0 3 2  Down, Depressed, Hopeless 0 1 1  PHQ - 2 Score 0 4 3  Altered sleeping  3 1  Tired, decreased energy  1 3  Change in appetite  3 3  Feeling bad or failure about yourself   3 1  Trouble concentrating  3 0  Moving slowly or fidgety/restless  0 0  Suicidal thoughts  1 0  PHQ-9 Score  18 11  Difficult doing work/chores  Very difficult      Physical Exam  BP 106/66   Pulse 64   Ht 5\' 3"  (1.6 m)   Wt 291 lb (132 kg)   SpO2 99%   BMI 51.55 kg/m   General Appearance:    Alert, cooperative, obese, no distress, appears stated age  Head:    Normocephalic, without obvious abnormality, atraumatic  Eyes:    PERRL, conjunctiva/corneas clear, EOM's intact, fundi    benign, both eyes  Ears:    Normal TM's and external ear canals, both ears  Nose:   Nares normal, septum midline, mucosa normal, no drainage    or sinus tenderness  Throat:   Lips, mucosa, and tongue normal; teeth and gums normal  Neck:   Supple, symmetrical, trachea midline, no adenopathy;    thyroid:  no enlargement/tenderness/nodules; no carotid   bruit or JVD  Back:     Symmetric, no curvature, ROM normal, no CVA tenderness  Lungs:     Clear to auscultation bilaterally, respirations unlabored  Chest Wall:    No tenderness or deformity   Heart:    Regular rate and rhythm, S1 and S2 normal, no murmur, rub   or gallop     Abdomen:     Soft, non-tender, bowel sounds active all four quadrants,    Large abdominal well healed incision        Extremities:   Extremities normal, atraumatic, no cyanosis or edema  Pulses:   2+ and symmetric all extremities  Skin:   Skin color, texture, turgor normal, no rashes or lesions  Lymph nodes:   Cervical,  supraclavicular, and axillary nodes normal  Neurologic:   CNII-XII intact, normal strength, sensation and reflexes    throughout      Assessment & Plan:    Routine Health Maintenance and Physical Exam  Immunization History  Administered Date(s) Administered   Tdap 04/17/2014    Health Maintenance  Topic Date Due   Hepatitis C Screening  Never done   COVID-19 Vaccine (1) 10/08/2022 (Originally 01/06/1979)   INFLUENZA VACCINE  10/14/2022   PAP SMEAR-Modifier  04/08/2024   DTaP/Tdap/Td (2 - Td or Tdap) 04/17/2024   HIV Screening  Completed   HPV VACCINES  Aged Out    Discussed health benefits of physical activity, and encouraged her to engage in regular exercise appropriate for her age and condition.  Marland KitchenTrula Ore was seen today for annual exam.  Diagnoses and all orders for this visit:  Routine physical examination -     TSH -     Lipid Panel w/reflex Direct LDL -     COMPLETE METABOLIC PANEL WITH GFR -     CBC with Differential/Platelet  Class 3 severe obesity due to excess calories without serious comorbidity with body mass index (BMI) of 50.0 to 59.9 in adult [facility] [place])  Screening for diabetes mellitus -     COMPLETE METABOLIC PANEL WITH GFR  Screening for lipid disorders -     Lipid Panel w/reflex Direct LDL  Tobacco dependence -     Varenicline Tartrate, Starter, (CHANTIX STARTING MONTH PAK) 0.5 MG X 11 & 1 MG X 42 TBPK; Take one 0.5 mg tablet by mouth once daily for 3 days, then increase to one  0.5 mg tablet twice daily for 4 days, then increase to one 1 mg tablet twice daily. -     varenicline (CHANTIX) 1 MG tablet; Take 1 tablet (1 mg total) by mouth 2 (two) times daily.  Encounter for hepatitis C screening test for low risk patient -     Hepatitis C Antibody  Visit for screening mammogram  Herpes zoster without complication   .Marland Kitchen Discussed 150 minutes of exercise a week.  Encouraged vitamin D 1000 units and Calcium 1300mg  or 4 servings of dairy a day.   Vitals look good PHQ no concerns Mammogram and Pap UTD Fasting labs ordered Discussed smoking cessation Sent chantix-discussed SE and titration up.  Cut back and stop over the first month then continue for 2 months Follow up in 3 months  .Marland KitchenDiscussed low carb diet with 1500 calories and 80g of protein.  Exercising at least 150 minutes a week.  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  Discussed medication options Not candidate for GLP with GI issues Will consider at 3 month follow up      Tandy Gaw, PA-C

## 2022-09-22 NOTE — Telephone Encounter (Signed)
Patient called stated the pharmacy needs a PA for  Chantix Starting Pack 0.5mg  x 11 x 1 mg x42 tbk Please advise  [facility] [phone]

## 2022-09-22 NOTE — Patient Instructions (Signed)

## 2022-09-23 ENCOUNTER — Other Ambulatory Visit: Payer: Self-pay | Admitting: Physician Assistant

## 2022-09-23 DIAGNOSIS — R7989 Other specified abnormal findings of blood chemistry: Secondary | ICD-10-CM

## 2022-09-23 LAB — CBC WITH DIFFERENTIAL/PLATELET
Absolute Monocytes: 1302 cells/uL — ABNORMAL HIGH (ref 200–950)
Basophils Relative: 0.3 %
Eosinophils Absolute: 74 cells/uL (ref 15–500)
HCT: 44.6 % (ref 35.0–45.0)
Hemoglobin: 14.7 g/dL (ref 11.7–15.5)
MCH: 30.2 pg (ref 27.0–33.0)
MCHC: 33 g/dL (ref 32.0–36.0)
MCV: 91.6 fL (ref 80.0–100.0)
Monocytes Relative: 7 %
Neutrophils Relative %: 64.7 %
RBC: 4.87 10*6/uL (ref 3.80–5.10)
RDW: 12.9 % (ref 11.0–15.0)

## 2022-09-23 LAB — LIPID PANEL W/REFLEX DIRECT LDL
Cholesterol: 194 mg/dL (ref <200)
HDL: 41 mg/dL — ABNORMAL LOW (ref >=50)
LDL Cholesterol (Calc): 135 mg/dL (calc) — ABNORMAL HIGH
Non-HDL Cholesterol (Calc): 153 mg/dL (calc) — ABNORMAL HIGH (ref <130)
Total CHOL/HDL Ratio: 4.7 (calc) (ref <5.0)
Triglycerides: 82 mg/dL (ref <150)

## 2022-09-23 LAB — COMPLETE METABOLIC PANEL WITH GFR
AG Ratio: 1.5 (calc) (ref 1.0–2.5)
ALT: 17 U/L (ref 6–29)
AST: 13 U/L (ref 10–30)
Albumin: 4 g/dL (ref 3.6–5.1)
Alkaline phosphatase (APISO): 49 U/L (ref 31–125)
BUN/Creatinine Ratio: 18 (calc) (ref 6–22)
BUN: 18 mg/dL (ref 7–25)
CO2: 24 mmol/L (ref 20–32)
Calcium: 9 mg/dL (ref 8.6–10.2)
Chloride: 104 mmol/L (ref 98–110)
Creat: 1.02 mg/dL — ABNORMAL HIGH (ref 0.50–0.99)
Globulin: 2.6 g/dL (calc) (ref 1.9–3.7)
Glucose, Bld: 79 mg/dL (ref 65–99)
Potassium: 4.4 mmol/L (ref 3.5–5.3)
Sodium: 138 mmol/L (ref 135–146)
Total Bilirubin: 0.3 mg/dL (ref 0.2–1.2)
Total Protein: 6.6 g/dL (ref 6.1–8.1)
eGFR: 70 mL/min/{1.73_m2} (ref >=60)

## 2022-09-23 LAB — HEPATITIS C ANTIBODY: Hepatitis C Ab: NONREACTIVE

## 2022-09-23 LAB — TSH: TSH: 0.86 mIU/L

## 2022-09-23 NOTE — Progress Notes (Signed)
Kristin Sawyer,   LDL not to goal. Weight loss can help with this. CV risk is still under 7.5 percent which is good.   Marland Kitchen.The 10-year ASCVD risk score (Arnett DK, et al., 2019) is: 2.8%   Values used to calculate the score:     Age: 42 years     Sex: Female     Is Non-Hispanic African American: No     Diabetic: No     Tobacco smoker: Yes     Systolic Blood Pressure: 106 mmHg     Is BP treated: No     HDL Cholesterol: 41 mg/dL     Total Cholesterol: 194 mg/dL  Creatinine is up some. GFR still good but we need to watch this and recheck in 1 month.  Liver enzymes look good.  Thyroid looks good.  WBC is up but likely due to prednisone which can cause that.

## 2022-09-24 ENCOUNTER — Telehealth: Payer: Self-pay

## 2022-09-24 ENCOUNTER — Encounter: Payer: Self-pay | Admitting: Physician Assistant

## 2022-09-24 NOTE — Telephone Encounter (Signed)
Initiated Prior authorization [id number] Tartrate (Starter) 0.5 MG X 11 &1 MG X 42 tablets Via: Covermymeds Case/Key:[id number] Status: Pending as of 09/24/22 Reason: Notified Pt via: Mychart

## 2022-10-14 ENCOUNTER — Encounter: Payer: Self-pay | Admitting: Physician Assistant

## 2022-12-24 ENCOUNTER — Ambulatory Visit: Payer: 59 | Admitting: Physician Assistant

## 2022-12-27 ENCOUNTER — Encounter: Payer: Self-pay | Admitting: Physician Assistant

## 2022-12-27 ENCOUNTER — Ambulatory Visit: Payer: 59 | Admitting: Physician Assistant

## 2022-12-27 VITALS — BP 122/65 | HR 89 | Ht 63.0 in | Wt 294.0 lb

## 2022-12-27 DIAGNOSIS — Z720 Tobacco use: Secondary | ICD-10-CM | POA: Diagnosis not present

## 2022-12-27 DIAGNOSIS — Z6841 Body Mass Index (BMI) 40.0 and over, adult: Secondary | ICD-10-CM

## 2022-12-27 DIAGNOSIS — E66813 Obesity, class 3: Secondary | ICD-10-CM | POA: Diagnosis not present

## 2022-12-27 DIAGNOSIS — Z716 Tobacco abuse counseling: Secondary | ICD-10-CM | POA: Diagnosis not present

## 2022-12-27 DIAGNOSIS — R7989 Other specified abnormal findings of blood chemistry: Secondary | ICD-10-CM | POA: Diagnosis not present

## 2022-12-27 NOTE — Patient Instructions (Signed)

## 2022-12-27 NOTE — Progress Notes (Unsigned)
Established Patient Office Visit  Subjective   Patient ID: Kristin Sawyer, female    DOB: Aug 23, 1980  Age: 42 y.o. MRN: [id number]  No chief complaint on file.   HPI Pt is a 42 yo obese female who presents to the clinic to follow up on smoking cessation. Chantix has not helped at all. She has not been able to cut back at all. She continues to smoke 1 pack a day for the last 20 years.   .. Active Ambulatory Problems    Diagnosis Date Noted   Active labor 06/29/2014   Spontaneous vaginal delivery 06/29/2014   Low iron stores 06/15/2016   Pernicious anemia 06/15/2016   Vitamin D deficiency 06/15/2016   Malnutrition of mild degree (HCC) 06/15/2016   No energy 06/15/2016   Depression, recurrent (HCC) 06/15/2016   Low back pain 06/15/2016   Class 3 severe obesity due to excess calories without serious comorbidity with body mass index (BMI) of 50.0 to 59.9 in adult [facility] 06/15/2016   LGSIL on Pap smear of cervix 12/30/2016   New daily persistent headache 05/31/2017   Status migrainosus 05/31/2017   Diverticulitis of intestine with abscess 07/10/2020   Diverticulitis 07/25/2020   Abscess, intestinal 07/25/2020   S/P partial colectomy 07/25/2020   Acute stress reaction 07/25/2020   Excessive crying 07/25/2020   Anxiety about health 07/25/2020   Colon cancer screening    History of colonic diverticulitis    Benign neoplasm of cecum    Diverticulitis of colon    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HCC)    S/P colostomy takedown 03/04/2021   Tobacco dependence 09/22/2022   Encounter for smoking cessation counseling 12/27/2022   Elevated serum creatinine 12/27/2022   Resolved Ambulatory Problems    Diagnosis Date Noted   No Resolved Ambulatory Problems   Past Medical History:  Diagnosis Date   B12 nutritional deficiency    Colonic diverticular abscess    Condyloma    Hiatal hernia    History of marijuana use    History of trichomoniasis 03/15/2013   HPV in female    Hx of abnormal  cervical Papanicolaou smear    Hx of adenoidectomy    Hx of tonsillectomy    Ovarian cyst    Pulmonary nodule      ROS See HPI.    Objective:     BP 122/65   Pulse 89   Ht 5\' 3"  (1.6 m)   Wt 294 lb (133.4 kg)   SpO2 99%   BMI 52.08 kg/m  BP Readings from Last 3 Encounters:  12/27/22 122/65  09/22/22 106/66  08/26/21 132/77   Wt Readings from Last 3 Encounters:  12/27/22 294 lb (133.4 kg)  09/22/22 291 lb (132 kg)  03/06/21 282 lb 10.1 oz (128.2 kg)      Physical Exam Constitutional:      Appearance: Normal appearance. She is obese.  HENT:     Head: Normocephalic.  Cardiovascular:     Rate and Rhythm: Normal rate and regular rhythm.     Pulses: Normal pulses.  Pulmonary:     Effort: Pulmonary effort is normal.     Breath sounds: Normal breath sounds.  Musculoskeletal:     Right lower leg: No edema.     Left lower leg: No edema.  Neurological:     General: No focal deficit present.     Mental Status: She is alert and oriented to person, place, and time.  Psychiatric:        Mood  and Affect: Mood normal.       The 10-year ASCVD risk score (Arnett DK, et al., 2019) is: 3.7%    Assessment & Plan:  Marland KitchenMarland KitchenDiagnoses and all orders for this visit:  Encounter for smoking cessation counseling -     Ambulatory referral to Smoking Cessation Program  Elevated serum creatinine -     Basic metabolic panel  Class 3 severe obesity due to excess calories without serious comorbidity with body mass index (BMI) of 50.0 to 59.9 in adult [facility])  Tobacco abuse -     Ambulatory referral to Smoking Cessation Program   Will check her serum creatinine to follow up on past elevation  Discussed smoking cessation Cut back by one cigarette each week HO given when she gets to 8 a day start patches Will refer to smoking cessation counseling through neuropsych at [facility]  .Marland KitchenDiscussed low carb diet with 1500 calories and 80g of protein.  Exercising at least 150 minutes a week.   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    Tandy Gaw, PA-C

## 2022-12-28 ENCOUNTER — Encounter: Payer: Self-pay | Admitting: Physician Assistant

## 2022-12-28 LAB — BASIC METABOLIC PANEL
BUN/Creatinine Ratio: 13 (ref 9–23)
BUN: 11 mg/dL (ref 6–24)
CO2: 20 mmol/L (ref 20–29)
Calcium: 8.9 mg/dL (ref 8.7–10.2)
Chloride: 105 mmol/L (ref 96–106)
Creatinine, Ser: 0.83 mg/dL (ref 0.57–1.00)
Glucose: 79 mg/dL (ref 70–99)
Potassium: 4.5 mmol/L (ref 3.5–5.2)
Sodium: 139 mmol/L (ref 134–144)
eGFR: 89 mL/min/{1.73_m2} (ref >59)

## 2022-12-28 NOTE — Progress Notes (Signed)
Kristin Sawyer,   Kidney function improved! Glucose, kidney, liver look great!

## 2023-03-29 ENCOUNTER — Ambulatory Visit: Payer: 59 | Admitting: Physician Assistant

## 2023-05-02 LAB — HM MAMMOGRAPHY

## 2023-10-20 ENCOUNTER — Telehealth: Payer: Self-pay

## 2023-10-20 NOTE — Telephone Encounter (Signed)
 I spoke with patient and she states she had a mammogram on 05/02/2023. I sent [facility] a record request.
